# Patient Record
Sex: Female | Born: 1960 | Race: Black or African American | Hispanic: No | Marital: Single | Smoking: Former smoker
Health system: Southern US, Community
[De-identification: ages and names within clinical notes are randomized; demographics above are authoritative.]

## PROBLEM LIST (undated history)

## (undated) DIAGNOSIS — E0590 Thyrotoxicosis, unspecified without thyrotoxic crisis or storm: Secondary | ICD-10-CM

## (undated) HISTORY — DX: Thyrotoxicosis, unspecified without thyrotoxic crisis or storm: E05.90

---

## 2005-03-16 ENCOUNTER — Emergency Department: Payer: Self-pay | Admitting: Emergency Medicine

## 2012-08-23 ENCOUNTER — Emergency Department: Payer: Self-pay | Admitting: Emergency Medicine

## 2013-04-22 ENCOUNTER — Encounter: Payer: Self-pay | Admitting: Adult Health

## 2013-04-22 ENCOUNTER — Ambulatory Visit (INDEPENDENT_AMBULATORY_CARE_PROVIDER_SITE_OTHER): Payer: BC Managed Care – PPO | Admitting: Adult Health

## 2013-04-22 ENCOUNTER — Encounter (INDEPENDENT_AMBULATORY_CARE_PROVIDER_SITE_OTHER): Payer: Self-pay

## 2013-04-22 VITALS — BP 140/78 | HR 66 | Temp 98.2°F | Resp 14 | Ht 61.75 in | Wt 122.0 lb

## 2013-04-22 DIAGNOSIS — Z1211 Encounter for screening for malignant neoplasm of colon: Secondary | ICD-10-CM | POA: Insufficient documentation

## 2013-04-22 DIAGNOSIS — Z1239 Encounter for other screening for malignant neoplasm of breast: Secondary | ICD-10-CM | POA: Insufficient documentation

## 2013-04-22 DIAGNOSIS — H0520 Unspecified exophthalmos: Secondary | ICD-10-CM | POA: Insufficient documentation

## 2013-04-22 LAB — TSH: TSH: 0 u[IU]/mL — AB (ref 0.35–5.50)

## 2013-04-22 NOTE — Progress Notes (Signed)
Pre visit review using our clinic review tool, if applicable. No additional management support is needed unless otherwise documented below in the visit note. 

## 2013-04-22 NOTE — Progress Notes (Signed)
Patient ID: Elizabeth Pacheco, female   DOB: 05-04-1960, 53 y.o.   MRN: [id number]    Subjective:    Patient ID: Elizabeth Pacheco, female    DOB: 01/15/1961, 53 y.o.   MRN: [id number]  HPI  Pt is a pleasant 53 y/o female who presents to clinic to establish care. She feels healthy. She has not been to a PCP in > 25 years. Reports "if it ain't broke then don't fix it". She has been constantly encouraged by multiple family members to find a PCP. No screening tests have been done. She has not had a mammogram or colonoscopy. No PAP in 25 years. She does have one concern. She has noticed that her right eye is slightly bulging. She reports that her eye doctor told her that she needed to have her thyroid checked. She began reading information on the internet and thought it was overwhelming. She finally decided to follow her families request to find a provider.   History reviewed. No pertinent past medical history.   History reviewed. No pertinent past surgical history.   Family History  Problem Relation Age of Onset  . Diabetes Mother   . Cancer Father     Lung cancer  . Diabetes Sister   . Diabetes Brother   . Cancer Maternal Grandmother     Breast cancer  . Diabetes Sister   . Diabetes Sister   . Diabetes Brother   . Diabetes Brother   . Diabetes Brother   . Diabetes Brother      History   Social History  . Marital Status: Single    Spouse Name: N/A    Number of Children: N/A  . Years of Education: N/A   Occupational History  .      [employer]   Social History Main Topics  . Smoking status: Former Smoker -- 0.50 packs/day for 39 years    Types: Cigarettes  . Smokeless tobacco: Not on file     Comment: quit feb 2013   . Alcohol Use: No  . Drug Use: No  . Sexual Activity: Not on file   Other Topics Concern  . Not on file   Social History Narrative   Elizabeth Pacheco grew up in [place], [place]. She lives at home in [place] with her sister and daughter. They have one dog. She  enjoys Web designer projects. She is very active in her church.     Review of Systems  Constitutional: Negative.   HENT: Negative.   Eyes: Negative.        Right eye bulging  Respiratory: Negative.   Cardiovascular: Negative.   Gastrointestinal: Negative.   Endocrine: Negative.   Genitourinary: Negative.   Musculoskeletal: Negative.   Skin: Negative.   Allergic/Immunologic: Negative.   Neurological: Negative.   Hematological: Negative.   Psychiatric/Behavioral: Negative.        Objective:  BP 140/78  Pulse 66  Temp(Src) 98.2 F (36.8 C) (Oral)  Resp 14  Ht 5' 1.75" (1.568 m)  Wt 122 lb (55.339 kg)  BMI 22.51 kg/m2  SpO2 99%   Physical Exam  Constitutional: She is oriented to person, place, and time. She appears well-developed and well-nourished. No distress.  HENT:  Head: Normocephalic and atraumatic.  Right Ear: External ear normal.  Left Ear: External ear normal.  Nose: Nose normal.  Mouth/Throat: Oropharynx is clear and moist.  Eyes: Conjunctivae and EOM are normal.  Mild exophthalmos  Neck: Normal range of motion. Neck supple.  Cardiovascular: Normal rate, regular rhythm,  normal heart sounds and intact distal pulses.  Exam reveals no gallop and no friction rub.   No murmur heard. Pulmonary/Chest: Effort normal and breath sounds normal. No respiratory distress. She has no wheezes. She has no rales.  Musculoskeletal: Normal range of motion.  Neurological: She is alert and oriented to person, place, and time. She has normal reflexes.  Skin: Skin is warm and dry.  Psychiatric: She has a normal mood and affect. Her behavior is normal. Judgment and thought content normal.       Assessment & Plan:   1. Screen for colon cancer Refer to GI for screening colonoscopy. Denies blood in stool, constipation or diarrhea. Denies GI symptoms  - Ambulatory referral to Gastroenterology  2. Screening for breast cancer Ordered screening mammogram. She has not been to a PCP in > 25  years.  - MM DIGITAL SCREENING BILATERAL; Future  3. Exophthalmos Mild symptoms on the right eye. Pt denies anxiety, emotional lability, weakness, tremor, palpitations, heat intolerance, increased perspiration, and weight loss   Check labs. Will follow  - TSH

## 2013-04-22 NOTE — Patient Instructions (Addendum)
   Thank you for choosing [facility] for your [facility] care needs.  Please have your labs drawn prior to leaving the office.  The results will be available through MyChart for your convenience. Please remember to activate this. The activation code is located at the end of this form.  Remember to schedule your complete physical exam including your PAP.  I am referring you to GI for your screening colonoscopy.  I have also provided you with a prescription for your screening Mammogram. You can call and schedule that at your earliest convenience.

## 2013-04-23 ENCOUNTER — Other Ambulatory Visit: Payer: Self-pay | Admitting: Adult Health

## 2013-04-23 DIAGNOSIS — E010 Iodine-deficiency related diffuse (endemic) goiter: Secondary | ICD-10-CM

## 2013-04-29 ENCOUNTER — Ambulatory Visit: Payer: Self-pay | Admitting: Adult Health

## 2013-04-30 ENCOUNTER — Other Ambulatory Visit: Payer: Self-pay | Admitting: Adult Health

## 2013-04-30 ENCOUNTER — Other Ambulatory Visit (INDEPENDENT_AMBULATORY_CARE_PROVIDER_SITE_OTHER): Payer: BC Managed Care – PPO

## 2013-04-30 ENCOUNTER — Telehealth: Payer: Self-pay | Admitting: *Deleted

## 2013-04-30 DIAGNOSIS — E049 Nontoxic goiter, unspecified: Secondary | ICD-10-CM

## 2013-04-30 DIAGNOSIS — E010 Iodine-deficiency related diffuse (endemic) goiter: Secondary | ICD-10-CM

## 2013-04-30 DIAGNOSIS — E041 Nontoxic single thyroid nodule: Secondary | ICD-10-CM

## 2013-04-30 DIAGNOSIS — Z1239 Encounter for other screening for malignant neoplasm of breast: Secondary | ICD-10-CM

## 2013-04-30 LAB — T3, FREE: T3 FREE: 5.2 pg/mL — AB (ref 2.3–4.2)

## 2013-04-30 LAB — T4, FREE: FREE T4: 1.48 ng/dL (ref 0.60–1.60)

## 2013-04-30 NOTE — Telephone Encounter (Signed)
Advised pt of US results, advised pt that Amber would be in contact with her with an appt with the endocrine doctor.

## 2013-05-01 ENCOUNTER — Encounter: Payer: Self-pay | Admitting: Endocrinology

## 2013-05-01 ENCOUNTER — Ambulatory Visit (INDEPENDENT_AMBULATORY_CARE_PROVIDER_SITE_OTHER): Payer: BC Managed Care – PPO | Admitting: Endocrinology

## 2013-05-01 VITALS — BP 116/78 | HR 61 | Temp 97.8°F | Ht 61.75 in | Wt 122.0 lb

## 2013-05-01 DIAGNOSIS — E0590 Thyrotoxicosis, unspecified without thyrotoxic crisis or storm: Secondary | ICD-10-CM

## 2013-05-01 NOTE — Progress Notes (Signed)
Subjective:    Patient ID: Elizabeth Pacheco, female    DOB: Dec 09, 1960, 53 y.o.   MRN: [id number]  HPI Pt reports he was dx'ed with hyperthyroidism in early 2015.  she has never been on therapy for this.  she has never had XRT to the anterior neck, or thyroid surgery.  he does not consume kelp or any other prescribed or non-prescribed thyroid medication.  she has never been on amiodarone.  She has slight prominence of the right eye, but no assoc tearing.   No past medical history on file.  No past surgical history on file.  History   Social History  . Marital Status: Single    Spouse Name: N/A    Number of Children: N/A  . Years of Education: N/A   Occupational History  .      [employer]   Social History Main Topics  . Smoking status: Former Smoker -- 0.50 packs/day for 39 years    Types: Cigarettes  . Smokeless tobacco: Not on file     Comment: quit feb 2013   . Alcohol Use: No  . Drug Use: No  . Sexual Activity: Not on file   Other Topics Concern  . Not on file   Social History Narrative   Elizabeth Pacheco grew up in [place], [place]. She lives at home in [place] with her sister and daughter. They have one dog. She enjoys Web designer projects. She is very active in her church.    No current outpatient prescriptions on file prior to visit.   No current facility-administered medications on file prior to visit.    Allergies  Allergen Reactions  . Penicillins     Family History  Problem Relation Age of Onset  . Diabetes Mother   . Cancer Father     Lung cancer  . Diabetes Sister   . Diabetes Brother   . Cancer Maternal Grandmother     Breast cancer  . Diabetes Sister   . Diabetes Sister   . Diabetes Brother   . Diabetes Brother   . Diabetes Brother   . Diabetes Brother   half-sister had thyroidectomy (uncertain reason, but not cancer)  BP 116/78  Pulse 61  Temp(Src) 97.8 F (36.6 C) (Oral)  Ht 5' 1.75" (1.568 m)  Wt 122 lb (55.339 kg)  BMI 22.51 kg/m2  SpO2  97%    Review of Systems denies headache, hoarseness, double vision, palpitations, sob, diarrhea, polyuria, myalgias, excessive diaphoresis, numbness, tremor, anxiety, easy bruising, and rhinorrhea.  She has weight gain.  She has "hot flashes."     Objective:   Physical Exam VS: see vs page GEN: no distress HEAD: head: no deformity eyes: no periorbital swelling; moderate right proptosis (none on the left) external nose and ears are normal mouth: no lesion seen NECK: supple, thyroid is not enlarged CHEST WALL: no deformity LUNGS:  Clear to auscultation CV: reg rate and rhythm, no murmur ABD: abdomen is soft, nontender.  no hepatosplenomegaly.  not distended.  no hernia MUSCULOSKELETAL: muscle bulk and strength are grossly normal.  no obvious joint swelling.  gait is normal and steady EXTEMITIES: no deformity.  no edema PULSES: no carotid bruit NEURO:  cn 2-12 grossly intact.   readily moves all 4's.  sensation is intact to touch on all 4's SKIN:  Normal texture and temperature.  No rash or suspicious lesion is visible.   NODES:  None palpable at the neck PSYCH: alert, well-oriented.  Does not appear anxious nor depressed.   (  i reviewed US report) Lab Results  Component Value Date   TSH 0.00* 04/22/2013      Assessment & Plan:  Grave's dz, with opthalmopathy. Hyperthyroidism, due to the grave's dz Weight gain: sometimes seen in hyperthyroidism.

## 2013-05-01 NOTE — Patient Instructions (Signed)
Please consider the options we discussed, and let me know. If you chose the radioactive iodine, we would check a thyroid "scan" (a special, but easy and painless type of thyroid x ray).  It works like this: you go to the x-ray department of the hospital to swallow a pill, which contains a miniscule amount of radiation.  You will not notice any symptoms from this.  You will go back to the x-ray department the next day, to lie down in front of a camera.  The results of this will be sent to me.   Based on the results, i hope to order for you a treatment pill of radioactive iodine.  Although it is a larger amount of radiation, you will again notice no symptoms from this.  The pill is gone from your body in a few days (during which you should stay away from other people), but takes several months to work.  Therefore, please return here approximately 6-8 weeks after the treatment.  This treatment has been available for many years, and the only known side-effect is an underactive thyroid.  It is possible that i would eventually prescribe for you a thyroid hormone pill, which is very inexpensive.  You don't have to worry about side-effects of this thyroid hormone pill, because it is the same molecule your thyroid makes.

## 2013-05-05 ENCOUNTER — Other Ambulatory Visit: Payer: Self-pay | Admitting: Adult Health

## 2013-05-05 DIAGNOSIS — E0590 Thyrotoxicosis, unspecified without thyrotoxic crisis or storm: Secondary | ICD-10-CM

## 2013-05-06 ENCOUNTER — Other Ambulatory Visit (HOSPITAL_COMMUNITY)
Admission: RE | Admit: 2013-05-06 | Discharge: 2013-05-06 | Disposition: A | Discharge status: Home / Self Care | Payer: BC Managed Care – PPO | Source: Ambulatory Visit | Attending: Adult Health | Admitting: Adult Health

## 2013-05-06 ENCOUNTER — Ambulatory Visit (INDEPENDENT_AMBULATORY_CARE_PROVIDER_SITE_OTHER): Payer: BC Managed Care – PPO | Admitting: Adult Health

## 2013-05-06 ENCOUNTER — Encounter: Payer: Self-pay | Admitting: Adult Health

## 2013-05-06 VITALS — BP 118/74 | HR 55 | Temp 98.0°F | Ht 61.8 in | Wt 121.0 lb

## 2013-05-06 DIAGNOSIS — Z0000 Encounter for general adult medical examination without abnormal findings: Secondary | ICD-10-CM | POA: Insufficient documentation

## 2013-05-06 DIAGNOSIS — Z1151 Encounter for screening for human papillomavirus (HPV): Secondary | ICD-10-CM | POA: Insufficient documentation

## 2013-05-06 DIAGNOSIS — Z01419 Encounter for gynecological examination (general) (routine) without abnormal findings: Secondary | ICD-10-CM | POA: Insufficient documentation

## 2013-05-06 DIAGNOSIS — Z13820 Encounter for screening for osteoporosis: Secondary | ICD-10-CM | POA: Insufficient documentation

## 2013-05-06 LAB — CBC WITH DIFFERENTIAL/PLATELET
BASOS PCT: 0.3 % (ref 0.0–3.0)
Basophils Absolute: 0 10*3/uL (ref 0.0–0.1)
EOS PCT: 2 % (ref 0.0–5.0)
Eosinophils Absolute: 0.2 10*3/uL (ref 0.0–0.7)
HEMATOCRIT: 42.3 % (ref 36.0–46.0)
Hemoglobin: 14.1 g/dL (ref 12.0–15.0)
LYMPHS ABS: 4.2 10*3/uL — AB (ref 0.7–4.0)
Lymphocytes Relative: 40.5 % (ref 12.0–46.0)
MCHC: 33.2 g/dL (ref 30.0–36.0)
MCV: 87 fl (ref 78.0–100.0)
MONO ABS: 0.8 10*3/uL (ref 0.1–1.0)
MONOS PCT: 7.4 % (ref 3.0–12.0)
Neutro Abs: 5.1 10*3/uL (ref 1.4–7.7)
Neutrophils Relative %: 49.8 % (ref 43.0–77.0)
Platelets: 210 10*3/uL (ref 150.0–400.0)
RBC: 4.87 Mil/uL (ref 3.87–5.11)
RDW: 12.9 % (ref 11.5–14.6)
WBC: 10.3 10*3/uL (ref 4.5–10.5)

## 2013-05-06 LAB — COMPREHENSIVE METABOLIC PANEL
ALK PHOS: 170 U/L — AB (ref 39–117)
ALT: 31 U/L (ref 0–35)
AST: 24 U/L (ref 0–37)
Albumin: 4.2 g/dL (ref 3.5–5.2)
BILIRUBIN TOTAL: 0.5 mg/dL (ref 0.3–1.2)
BUN: 11 mg/dL (ref 6–23)
CO2: 27 mEq/L (ref 19–32)
Calcium: 10.8 mg/dL — ABNORMAL HIGH (ref 8.4–10.5)
Chloride: 106 mEq/L (ref 96–112)
Creatinine, Ser: 0.7 mg/dL (ref 0.4–1.2)
GFR: 113.77 mL/min (ref >60.00)
Glucose, Bld: 84 mg/dL (ref 70–99)
Potassium: 3.8 mEq/L (ref 3.5–5.1)
SODIUM: 142 meq/L (ref 135–145)
TOTAL PROTEIN: 7.7 g/dL (ref 6.0–8.3)

## 2013-05-06 LAB — LIPID PANEL
CHOL/HDL RATIO: 3
Cholesterol: 172 mg/dL (ref 0–200)
HDL: 51.5 mg/dL (ref >39.00)
LDL Cholesterol: 103 mg/dL — ABNORMAL HIGH (ref 0–99)
Triglycerides: 89 mg/dL (ref 0.0–149.0)
VLDL: 17.8 mg/dL (ref 0.0–40.0)

## 2013-05-06 LAB — VITAMIN B12: Vitamin B-12: 571 pg/mL (ref 211–911)

## 2013-05-06 NOTE — Patient Instructions (Signed)
  You had your yearly physical exam today.  I will call you with results of your labs and PAP once they are available.  I am ordering a DEXA scan to screen for osteoporosis.   I have already referred you to GI (Dr. Mec[facility]lliott at [facility]). It takes them approximately 1 month to schedule an appointment. If you have not heard from their office within the next few weeks please call our office and ask to [facility] to [facility].  Mammogram tomorrow.  Please call with any questions or concerns.

## 2013-05-06 NOTE — Addendum Note (Signed)
Addended by: Montine CircleMALDONADO, Kinza Gouveia D on: 05/06/2013 03:30 PM   Modules accepted: Orders

## 2013-05-06 NOTE — Progress Notes (Signed)
Patient ID: Elizabeth Pacheco, female   DOB: Dec 17, 1960, 53 y.o.   MRN: [id number]    Subjective:    Patient ID: Elizabeth NedCheryl Chapa, female    DOB: Dec 17, 1960, 53 y.o.   MRN: [id number]  HPI Pt is a pleasant 53 y/o female who presents for her yearly physical including breast and PAP. She has recently been seen by endocrine for hyperthyroidism. She is schedule on 05/27/13 for radioactive iodine treatment. She is scheduled for her mammogram tomorrow. She has been referred to GI for screening colonoscopy but reports that she has not been notified about appointment.   Review of Systems  Constitutional: Negative.   HENT: Negative.   Eyes: Negative.   Respiratory: Negative.   Cardiovascular: Negative.   Gastrointestinal: Negative.   Endocrine: Negative.   Genitourinary: Negative.   Musculoskeletal: Negative.   Skin: Negative.   Allergic/Immunologic: Negative.   Neurological: Negative.   Hematological: Negative.   Psychiatric/Behavioral: Negative.   All other systems reviewed and are negative.       Objective:  BP 118/74  Pulse 55  Temp(Src) 98 F (36.7 C) (Oral)  Ht 5' 1.8" (1.57 m)  Wt 121 lb (54.885 kg)  BMI 22.27 kg/m2  SpO2 99%   Physical Exam  Constitutional: She is oriented to person, place, and time. She appears well-developed and well-nourished. No distress.  HENT:  Head: Normocephalic and atraumatic.  Right Ear: External ear normal.  Left Ear: External ear normal.  Nose: Nose normal.  Mouth/Throat: Oropharynx is clear and moist.  Eyes: Conjunctivae and EOM are normal. Pupils are equal, round, and reactive to light.  Right eye exophthalmos   Neck: Normal range of motion. Neck supple. No tracheal deviation present. No thyromegaly present.  Cardiovascular: Normal rate, regular rhythm, normal heart sounds and intact distal pulses.  Exam reveals no gallop and no friction rub.   No murmur heard. Pulmonary/Chest: Effort normal and breath sounds normal. No respiratory distress. She  has no wheezes. She has no rales.  Abdominal: Soft. Bowel sounds are normal. She exhibits no distension and no mass. There is no tenderness. There is no rebound and no guarding. Hernia confirmed negative in the right inguinal area and confirmed negative in the left inguinal area.  Genitourinary: Uterus normal. Rectal exam shows no external hemorrhoid, no internal hemorrhoid, no fissure, no mass and no tenderness. Guaiac negative stool. No breast tenderness, discharge or bleeding. No labial fusion. There is no rash, tenderness, lesion or injury on the right labia. There is no rash, tenderness, lesion or injury on the left labia. Uterus is not deviated, not enlarged, not fixed and not tender. Cervix exhibits discharge. Cervix exhibits no motion tenderness and no friability. Right adnexum displays no mass, no tenderness and no fullness. Left adnexum displays no mass, no tenderness and no fullness. No erythema, tenderness or bleeding around the vagina. No foreign body around the vagina. No signs of injury around the vagina. Vaginal discharge found.  Musculoskeletal: Normal range of motion. She exhibits no edema and no tenderness.  Lymphadenopathy:    She has no cervical adenopathy.       Right: No inguinal adenopathy present.       Left: No inguinal adenopathy present.  Neurological: She is alert and oriented to person, place, and time. She has normal reflexes. No cranial nerve deficit. Coordination normal.  Skin: Skin is warm and dry.  Psychiatric: She has a normal mood and affect. Her behavior is normal. Judgment and thought content normal.  Assessment & Plan:   1. Routine general medical examination at a health care facility Normal physical exam including breast, pelvic/PAP. External genitalia without lesions, ulcerations, inflammation, warts. There is discharge noted. There are no external hemorrhoids. No cystocele, rectocele or prolapsed uterus. Speculum examination normal. Cervix without  inflammation, lesions, growth, nodules. Thick discharge noted. There was no bleeding. Vaginal walls also normal - pink and rugose without inflammation, discharge, ulcers or color changes. Bimanual exam also normal.  No tenderness noted with palpation of the uterus. No adnexal masses appreciated during exam. Mammogram scheduled for tomorrow. She has already been referred to GI for screening colonoscopy.  - CBC with Differential - Lipid panel - Vit D  25 hydroxy (rtn osteoporosis monitoring) - Vitamin B12 - Comprehensive metabolic panel - DG Bone Density; Future  2. Screening for osteoporosis Dexa scan ordered

## 2013-05-06 NOTE — Progress Notes (Signed)
Pre visit review using our clinic review tool, if applicable. No additional management support is needed unless otherwise documented below in the visit note. 

## 2013-05-07 ENCOUNTER — Ambulatory Visit: Payer: Self-pay | Admitting: Adult Health

## 2013-05-07 ENCOUNTER — Encounter: Payer: Self-pay | Admitting: Emergency Medicine

## 2013-05-07 ENCOUNTER — Other Ambulatory Visit: Payer: Self-pay | Admitting: Adult Health

## 2013-05-07 LAB — VITAMIN D 25 HYDROXY (VIT D DEFICIENCY, FRACTURES): Vit D, 25-Hydroxy: 22 ng/mL — ABNORMAL LOW (ref 30–89)

## 2013-05-07 LAB — WET PREP BY MOLECULAR PROBE
Candida species: NEGATIVE
Gardnerella vaginalis: NEGATIVE
TRICHOMONAS VAG: POSITIVE — AB

## 2013-05-07 MED ORDER — METRONIDAZOLE 500 MG PO TABS: 500.0000 mg | ORAL_TABLET | Freq: Once | ORAL | Status: DC

## 2013-05-07 NOTE — Progress Notes (Signed)
Spoke with pt about positive trichomonas. She will come in at her earliest convenience for additional STD testing. Metronidazole 2000 mg once

## 2013-05-08 ENCOUNTER — Encounter: Payer: Self-pay | Admitting: Adult Health

## 2013-05-08 ENCOUNTER — Ambulatory Visit (INDEPENDENT_AMBULATORY_CARE_PROVIDER_SITE_OTHER): Payer: BC Managed Care – PPO | Admitting: Adult Health

## 2013-05-08 VITALS — BP 122/70 | HR 60 | Temp 98.3°F | Resp 16 | Wt 121.0 lb

## 2013-05-08 DIAGNOSIS — Z113 Encounter for screening for infections with a predominantly sexual mode of transmission: Secondary | ICD-10-CM

## 2013-05-08 NOTE — Progress Notes (Signed)
Pre visit review using our clinic review tool, if applicable. No additional management support is needed unless otherwise documented below in the visit note. 

## 2013-05-08 NOTE — Progress Notes (Signed)
Raquel advised pt

## 2013-05-08 NOTE — Progress Notes (Signed)
Patient ID: Elizabeth Pacheco, female   DOB: Mar 24, 1960, 53 y.o.   MRN: [id number]    Subjective:    Patient ID: Elizabeth NedCheryl Mcneel, female    DOB: Mar 24, 1960, 53 y.o.   MRN: [id number]  HPI  Patient is a pleasant 53 year old female who recently tested positive for trichomonas. She is here for a additional STD testing. She is not experiencing any symptoms.  Past Medical History  Diagnosis Date  . Hyperthyroidism     Current Outpatient Prescriptions on File Prior to Visit  Medication Sig Dispense Refill  . metroNIDAZOLE (FLAGYL) 500 MG tablet Take 1 tablet (500 mg total) by mouth once.  4 tablet  0   No current facility-administered medications on file prior to visit.     Review of Systems  Genitourinary: Negative.  Negative for dysuria, vaginal discharge, difficulty urinating, genital sores, vaginal pain and pelvic pain.  All other systems reviewed and are negative.       Objective:  BP 122/70  Pulse 60  Temp(Src) 98.3 F (36.8 C) (Oral)  Resp 16  Wt 121 lb (54.885 kg)  SpO2 99%   Physical Exam  Constitutional: She is oriented to person, place, and time. She appears well-developed and well-nourished. No distress.  Cardiovascular: Normal rate.   Pulmonary/Chest: Effort normal. No respiratory distress.  Musculoskeletal: Normal range of motion.  Neurological: She is alert and oriented to person, place, and time.  Skin: Skin is warm and dry.  Psychiatric: She has a normal mood and affect. Her behavior is normal. Judgment and thought content normal.      Assessment & Plan:   1. Screen for STD (sexually transmitted disease) Pt is very concerned about recent STD. Spent time discussing results of her recent test and explanation for testing for other STD. Provided time to answer any questions that she had.  - GC/chlamydia probe amp, urine - RPR - HIV antibody - HSV(herpes simplex vrs) 1+2 ab-IgG

## 2013-05-09 LAB — GC/CHLAMYDIA PROBE AMP, URINE
CHLAMYDIA, SWAB/URINE, PCR: NEGATIVE
GC PROBE AMP, URINE: NEGATIVE

## 2013-05-09 LAB — HIV ANTIBODY (ROUTINE TESTING W REFLEX): HIV: NONREACTIVE

## 2013-05-09 LAB — RPR

## 2013-05-12 LAB — HSV(HERPES SIMPLEX VRS) I + II AB-IGG
HSV 1 GLYCOPROTEIN G AB, IGG: 8.28 IV — AB
HSV 2 GLYCOPROTEIN G AB, IGG: 3.36 IV — AB

## 2013-05-26 ENCOUNTER — Encounter: Payer: Self-pay | Admitting: Adult Health

## 2013-05-27 ENCOUNTER — Ambulatory Visit: Payer: Self-pay | Admitting: Endocrinology

## 2013-05-28 ENCOUNTER — Telehealth: Payer: Self-pay | Admitting: Endocrinology

## 2013-05-28 DIAGNOSIS — E0590 Thyrotoxicosis, unspecified without thyrotoxic crisis or storm: Secondary | ICD-10-CM

## 2013-05-28 NOTE — Telephone Encounter (Signed)
please call patient: i have ordered the radioactive iodine pill for you.  you will receive a phone call, about a day and time for an appointment

## 2013-05-28 NOTE — Telephone Encounter (Signed)
Pt informed

## 2013-06-16 ENCOUNTER — Ambulatory Visit: Payer: Self-pay | Admitting: Endocrinology

## 2013-06-18 ENCOUNTER — Encounter: Payer: Self-pay | Admitting: Adult Health

## 2013-06-19 ENCOUNTER — Other Ambulatory Visit: Payer: Self-pay | Admitting: Endocrinology

## 2013-06-19 ENCOUNTER — Other Ambulatory Visit: Payer: Self-pay

## 2013-06-19 LAB — HCG, QUANTITATIVE, PREGNANCY: Beta Hcg, Quant.: 2 m[IU]/mL

## 2013-06-22 ENCOUNTER — Ambulatory Visit: Payer: Self-pay | Admitting: Endocrinology

## 2013-06-24 ENCOUNTER — Encounter: Payer: Self-pay | Admitting: Endocrinology

## 2013-07-14 ENCOUNTER — Encounter: Payer: Self-pay | Admitting: Endocrinology

## 2013-08-13 ENCOUNTER — Encounter: Payer: Self-pay | Admitting: Endocrinology

## 2013-08-13 ENCOUNTER — Ambulatory Visit (INDEPENDENT_AMBULATORY_CARE_PROVIDER_SITE_OTHER): Payer: BC Managed Care – PPO | Admitting: Endocrinology

## 2013-08-13 VITALS — BP 122/86 | HR 56 | Temp 98.6°F | Ht 61.75 in | Wt 117.0 lb

## 2013-08-13 DIAGNOSIS — E0590 Thyrotoxicosis, unspecified without thyrotoxic crisis or storm: Secondary | ICD-10-CM

## 2013-08-13 LAB — T4, FREE: FREE T4: 1.77 ng/dL — AB (ref 0.60–1.60)

## 2013-08-13 LAB — TSH: TSH: 0.04 u[IU]/mL — AB (ref 0.35–4.50)

## 2013-08-13 NOTE — Patient Instructions (Signed)
blood tests are being requested for you today.  We'll contact you with results. Please come back for a follow-up appointment in 6 weeks.   

## 2013-08-13 NOTE — Progress Notes (Signed)
   Subjective:    Patient ID: Elizabeth Pacheco, female    DOB: 10-Sep-1960, 53 y.o.   MRN: [id number]  HPI Pt returns for f/u of hyperthyroidism (US had several small nodules, but scan was c/w Grave's (in early 2015; she had I-131 rx in May of 2015; she has never had XRT to the anterior neck, or thyroid surgery;  she does not consume kelp or any other prescribed or non-prescribed thyroid medication; she has never been on amiodarone). pt states she feels no different, and well in general.   Past Medical History  Diagnosis Date  . Hyperthyroidism     No past surgical history on file.  History   Social History  . Marital Status: Single    Spouse Name: N/A    Number of Children: N/A  . Years of Education: N/A   Occupational History  .      [employer]   Social History Main Topics  . Smoking status: Former Smoker -- 0.50 packs/day for 39 years    Types: Cigarettes  . Smokeless tobacco: Not on file     Comment: quit feb 2013   . Alcohol Use: No  . Drug Use: No  . Sexual Activity: Not on file   Other Topics Concern  . Not on file   Social History Narrative   Elizabeth MaxwellCheryl grew up in [place]. She lives at home in [place] with her sister and daughter. They have one dog. She enjoys Web designerDIY projects. She is very active in her church.    Current Outpatient Prescriptions on File Prior to Visit  Medication Sig Dispense Refill  . metroNIDAZOLE (FLAGYL) 500 MG tablet Take 1 tablet (500 mg total) by mouth once.  4 tablet  0   No current facility-administered medications on file prior to visit.    Allergies  Allergen Reactions  . Penicillins     Family History  Problem Relation Age of Onset  . Diabetes Mother   . Cancer Father     Lung cancer  . Diabetes Sister   . Diabetes Brother   . Cancer Maternal Grandmother     Breast cancer  . Diabetes Sister   . Diabetes Sister   . Diabetes Brother   . Diabetes Brother   . Diabetes Brother   . Diabetes Brother     BP 122/86   Pulse 56  Temp(Src) 98.6 F (37 C) (Oral)  Ht 5' 1.75" (1.568 m)  Wt 117 lb (53.071 kg)  BMI 21.59 kg/m2  SpO2 99%   Review of Systems Denies weight change.      Objective:   Physical Exam VITAL SIGNS:  See vs page GENERAL: no distress Eyes: proptosis (R>>L). NECK: There is no palpable thyroid enlargement.  No thyroid nodule is palpable.  No palpable lymphadenopathy at the anterior neck.    Lab Results  Component Value Date   TSH 0.04* 08/13/2013      Assessment & Plan:  hyperthyroidism: not better yet  Patient is advised the following: Patient Instructions  blood tests are being requested for you today.  We'll contact you with results. Please come back for a follow-up appointment in 6 weeks.    Thyroid is not better yet. We'll give it more time.

## 2013-09-23 ENCOUNTER — Ambulatory Visit: Payer: BC Managed Care – PPO | Admitting: Endocrinology

## 2013-09-23 DIAGNOSIS — Z0289 Encounter for other administrative examinations: Secondary | ICD-10-CM

## 2014-03-31 ENCOUNTER — Ambulatory Visit (INDEPENDENT_AMBULATORY_CARE_PROVIDER_SITE_OTHER): Payer: BLUE CROSS/BLUE SHIELD | Admitting: Endocrinology

## 2014-03-31 ENCOUNTER — Encounter: Payer: Self-pay | Admitting: Endocrinology

## 2014-03-31 VITALS — BP 114/80 | HR 49 | Temp 98.2°F | Ht 61.5 in | Wt 132.0 lb

## 2014-03-31 DIAGNOSIS — E0590 Thyrotoxicosis, unspecified without thyrotoxic crisis or storm: Secondary | ICD-10-CM

## 2014-03-31 LAB — T4, FREE: Free T4: 0 ng/dL — ABNORMAL LOW (ref 0.60–1.60)

## 2014-03-31 LAB — TSH: TSH: 79.63 u[IU]/mL — ABNORMAL HIGH (ref 0.35–4.50)

## 2014-03-31 MED ORDER — LEVOTHYROXINE SODIUM 125 MCG PO TABS: 125.0000 ug | ORAL_TABLET | Freq: Every day | ORAL | Status: DC

## 2014-03-31 NOTE — Progress Notes (Signed)
   Subjective:    Patient ID: Elizabeth Pacheco, female    DOB: 14-Oct-1960, 54 y.o.   MRN: [id number]  HPI Pt returns for f/u of hyperthyroidism (US had several small nodules, but scan was c/w Grave's (in early 2015; she had I-131 rx in May of 2015; she has never had XRT to the anterior neck, or thyroid surgery;  she does not consume kelp or any other prescribed or non-prescribed thyroid medication; she has never been on amiodarone). She has hoarseness and weight gain Past Medical History  Diagnosis Date  . Hyperthyroidism     No past surgical history on file.  History   Social History  . Marital Status: Single    Spouse Name: N/A  . Number of Children: N/A  . Years of Education: N/A   Occupational History  .      [employer]   Social History Main Topics  . Smoking status: Former Smoker -- 0.50 packs/day for 39 years    Types: Cigarettes  . Smokeless tobacco: Not on file     Comment: quit feb 2013   . Alcohol Use: No  . Drug Use: No  . Sexual Activity: Not on file   Other Topics Concern  . Not on file   Social History Narrative   Elizabeth MaxwellCheryl grew up in [place]. She lives at home in [place] with her sister and daughter. They have one dog. She enjoys Web designerDIY projects. She is very active in her church.    No current outpatient prescriptions on file prior to visit.   No current facility-administered medications on file prior to visit.    Allergies  Allergen Reactions  . Penicillins     Family History  Problem Relation Age of Onset  . Diabetes Mother   . Cancer Father     Lung cancer  . Diabetes Sister   . Diabetes Brother   . Cancer Maternal Grandmother     Breast cancer  . Diabetes Sister   . Diabetes Sister   . Diabetes Brother   . Diabetes Brother   . Diabetes Brother   . Diabetes Brother     BP 114/80 mmHg  Pulse 49  Temp(Src) 98.2 F (36.8 C) (Oral)  Ht 5' 1.5" (1.562 m)  Wt 132 lb (59.875 kg)  BMI 24.54 kg/m2  SpO2 98%  Review of  Systems She has fatigue    Objective:   Physical Exam VITAL SIGNS:  See vs page GENERAL: no distress Eyes: proptosis (R>>L). NECK: There is no palpable thyroid enlargement.  No thyroid nodule is palpable.  No palpable lymphadenopathy at the anterior neck.  Lab Results  Component Value Date   TSH 79.63* 03/31/2014       Assessment & Plan:  Post-I-131 hypothyroidism, new  Patient is advised the following: Patient Instructions  blood tests are being requested for you today.  We'll let you know about the results. Your thyroid is probably underactive, so i'll probably need to send a prescription for thyroid hormone to your pharmacy. Please come back for a follow-up appointment in 1 month.

## 2014-03-31 NOTE — Patient Instructions (Signed)
blood tests are being requested for you today.  We'll let you know about the results. Your thyroid is probably underactive, so i'll probably need to send a prescription for thyroid hormone to your pharmacy. Please come back for a follow-up appointment in 1 month.

## 2014-04-28 ENCOUNTER — Ambulatory Visit (INDEPENDENT_AMBULATORY_CARE_PROVIDER_SITE_OTHER): Payer: BLUE CROSS/BLUE SHIELD | Admitting: Endocrinology

## 2014-04-28 ENCOUNTER — Encounter: Payer: Self-pay | Admitting: Endocrinology

## 2014-04-28 VITALS — BP 132/80 | HR 61 | Temp 98.7°F | Ht 61.5 in | Wt 126.0 lb

## 2014-04-28 DIAGNOSIS — E890 Postprocedural hypothyroidism: Secondary | ICD-10-CM

## 2014-04-28 DIAGNOSIS — R490 Dysphonia: Secondary | ICD-10-CM

## 2014-04-28 LAB — T4, FREE: FREE T4: 1.89 ng/dL — AB (ref 0.60–1.60)

## 2014-04-28 LAB — TSH: TSH: 0.53 u[IU]/mL (ref 0.35–4.50)

## 2014-04-28 MED ORDER — LEVOTHYROXINE SODIUM 112 MCG PO TABS: 112.0000 ug | ORAL_TABLET | Freq: Every day | ORAL | Status: DC

## 2014-04-28 NOTE — Patient Instructions (Addendum)
blood tests are being requested for you today.  We'll let you know about the results.   Please see a specialist for the hoarseness.  you will receive a phone call, about a day and time for an appointment

## 2014-04-28 NOTE — Progress Notes (Signed)
   Subjective:    Patient ID: Elizabeth Pacheco, female    DOB: 08/26/1960, 54 y.o.   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  Moderate hoarseness persists, but there is no longer associated fatigue.  She says her fingernails are darkened.   Past Medical History  Diagnosis Date  . Hyperthyroidism     No past surgical history on file.  History   Social History  . Marital Status: Single    Spouse Name: N/A  . Number of Children: N/A  . Years of Education: N/A   Occupational History  .      [employer]   Social History Main Topics  . Smoking status: Former Smoker -- 0.50 packs/day for 39 years    Types: Cigarettes  . Smokeless tobacco: Not on file     Comment: quit feb 2013   . Alcohol Use: No  . Drug Use: No  . Sexual Activity: Not on file   Other Topics Concern  . Not on file   Social History Narrative   Elizabeth Pacheco grew up in [place]. She lives at home in [place] with her sister and daughter. They have one dog. She enjoys Web designerDIY projects. She is very active in her church.    No current outpatient prescriptions on file prior to visit.   No current facility-administered medications on file prior to visit.    Allergies  Allergen Reactions  . Penicillins     Family History  Problem Relation Age of Onset  . Diabetes Mother   . Cancer Father     Lung cancer  . Diabetes Sister   . Diabetes Brother   . Cancer Maternal Grandmother     Breast cancer  . Diabetes Sister   . Diabetes Sister   . Diabetes Brother   . Diabetes Brother   . Diabetes Brother   . Diabetes Brother     BP 132/80 mmHg  Pulse 61  Temp(Src) 98.7 F (37.1 C) (Oral)  Ht 5' 1.5" (1.562 m)  Wt 126  lb (57.153 kg)  BMI 23.42 kg/m2  SpO2 99%   Review of Systems Denies constipation and edema.      Objective:   Physical Exam VITAL SIGNS:  See vs page GENERAL: no distress. Eyes: proptosis (R>>L). NECK: There is no palpable thyroid enlargement.  No thyroid nodule is palpable.  No palpable lymphadenopathy at the anterior neck. Neuro: hoarseness persists.   Ext: fingernails are slightly dark.    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 Loss of voice, persistent. Dark fingernails, new, uncertain etiology.  We'll follow  Patient is advised the following: Patient Instructions  blood tests are being requested for you today.  We'll let you know about the results.   Please see a specialist for the hoarseness.  you will receive a phone call, about a day and time for an appointment  addendum: decrease synthroid to 112/d

## 2014-05-03 ENCOUNTER — Telehealth: Payer: Self-pay | Admitting: Endocrinology

## 2014-05-03 NOTE — Telephone Encounter (Signed)
Med has been lowered to 112 mg on her levothyroixine  What is the results of the lab that made this decrease occur

## 2014-05-03 NOTE — Telephone Encounter (Signed)
Contacted pt and advised that based off of lab work from 04/29/2014 thyroid medication needs to be decreased. Pt voiced understanding.

## 2014-10-29 ENCOUNTER — Other Ambulatory Visit: Payer: Self-pay | Admitting: Endocrinology

## 2014-11-25 ENCOUNTER — Telehealth: Payer: Self-pay | Admitting: Endocrinology

## 2014-11-26 ENCOUNTER — Ambulatory Visit: Payer: BLUE CROSS/BLUE SHIELD | Admitting: Endocrinology

## 2014-11-29 ENCOUNTER — Ambulatory Visit (INDEPENDENT_AMBULATORY_CARE_PROVIDER_SITE_OTHER): Payer: BLUE CROSS/BLUE SHIELD | Admitting: Endocrinology

## 2014-11-29 ENCOUNTER — Encounter: Payer: Self-pay | Admitting: Endocrinology

## 2014-11-29 VITALS — BP 136/82 | HR 60 | Temp 98.8°F | Ht 61.0 in | Wt 128.0 lb

## 2014-11-29 DIAGNOSIS — E890 Postprocedural hypothyroidism: Secondary | ICD-10-CM | POA: Diagnosis not present

## 2014-11-29 DIAGNOSIS — R490 Dysphonia: Secondary | ICD-10-CM

## 2014-11-29 LAB — T4, FREE: Free T4: 1.51 ng/dL (ref 0.60–1.60)

## 2014-11-29 LAB — TSH: TSH: 0.22 u[IU]/mL — AB (ref 0.35–4.50)

## 2014-11-29 MED ORDER — LEVOTHYROXINE SODIUM 75 MCG PO TABS: 75.0000 ug | ORAL_TABLET | Freq: Every day | ORAL | Status: DC

## 2014-11-29 NOTE — Patient Instructions (Addendum)
blood tests are being requested for you today.  We'll let you know about the results.   Please see a specialist for the hoarseness.  you will receive a phone call, about a day and time for an appointment. Please come back for a follow-up appointment in 6 months.

## 2014-11-29 NOTE — Telephone Encounter (Signed)
error 

## 2014-11-29 NOTE — Progress Notes (Signed)
   Subjective:    Patient ID: Elizabeth Pacheco, female    DOB: [birth year], 54 y.o.   MRN: [id number]  HPI Pt returns for f/u of hyperthyroidism (US had several small nodules, but scan was c/w Grave's dz; she had I-131 rx in May of 2015; she has never had XRT to the anterior neck, or thyroid surgery; she does not consume kelp or any other prescribed or non-prescribed thyroid medication; she has never been on amiodarone).   hoarseness persists.  She was ref to ENT, but she did not have insurance over the summer.   Past Medical History  Diagnosis Date  . Hyperthyroidism     No past surgical history on file.  Social History   Social History  . Marital Status: Single    Spouse Name: N/A  . Number of Children: N/A  . Years of Education: N/A   Occupational History  .      [employer]   Social History Main Topics  . Smoking status: Former Smoker -- 0.50 packs/day for 39 years    Types: Cigarettes  . Smokeless tobacco: Not on file     Comment: quit feb 2013   . Alcohol Use: No  . Drug Use: No  . Sexual Activity: Not on file   Other Topics Concern  . Not on file   Social History Narrative   Elnita MaxwellCheryl grew up in [place], [place]. She lives at home in [place] with her sister and daughter. They have one dog. She enjoys Web designerDIY projects. She is very active in her church.    No current outpatient prescriptions on file prior to visit.   No current facility-administered medications on file prior to visit.    Allergies  Allergen Reactions  . Penicillins     Family History  Problem Relation Age of Onset  . Diabetes Mother   . Cancer Father     Lung cancer  . Diabetes Sister   . Diabetes Brother   . Cancer Maternal Grandmother     Breast cancer  . Diabetes Sister   . Diabetes Sister   . Diabetes Brother   . Diabetes Brother   . Diabetes Brother   . Diabetes Brother     BP 136/82 mmHg  Pulse 60  Temp(Src) 98.8 F (37.1 C) (Oral)  Ht 5\' 1"  (1.549 m)  Wt 128 lb (58.06 kg)   BMI 24.20 kg/m2  SpO2 92%   Review of Systems Denies weight change.      Objective:   Physical Exam VITAL SIGNS:  See vs page.   GENERAL: no distress.   Eyes: there is bilateral proptosis (R slightly > L). NECK: There is no palpable thyroid enlargement.  No thyroid nodule is palpable.  No palpable lymphadenopathy at the anterior neck.   Neuro: hoarseness persists.  No tremor.  Gait is normal and steady.    Lab Results  Component Value Date   TSH 0.22* 11/29/2014      Assessment & Plan:  Post-I-131 hypothyroidism: she needs reduced rx.  This raises the possibility she is slowly evolving recurrent hyperthyroidism.  Hoarseness, persistent, uncertain etiology.  Patient is advised the following: Patient Instructions  blood tests are being requested for you today.  We'll let you know about the results.   Please see a specialist for the hoarseness.  you will receive a phone call, about a day and time for an appointment. Please come back for a follow-up appointment in 6 months.

## 2015-03-04 ENCOUNTER — Ambulatory Visit: Payer: BLUE CROSS/BLUE SHIELD | Admitting: Endocrinology

## 2015-03-23 IMAGING — CR RIGHT ANKLE - COMPLETE 3+ VIEW
2 series · 6 of 6 positions shown · non-contrast
Comparison: none

REASON FOR EXAM: fall, lateral ankle pain/swelling
COMMENTS:

[ap]
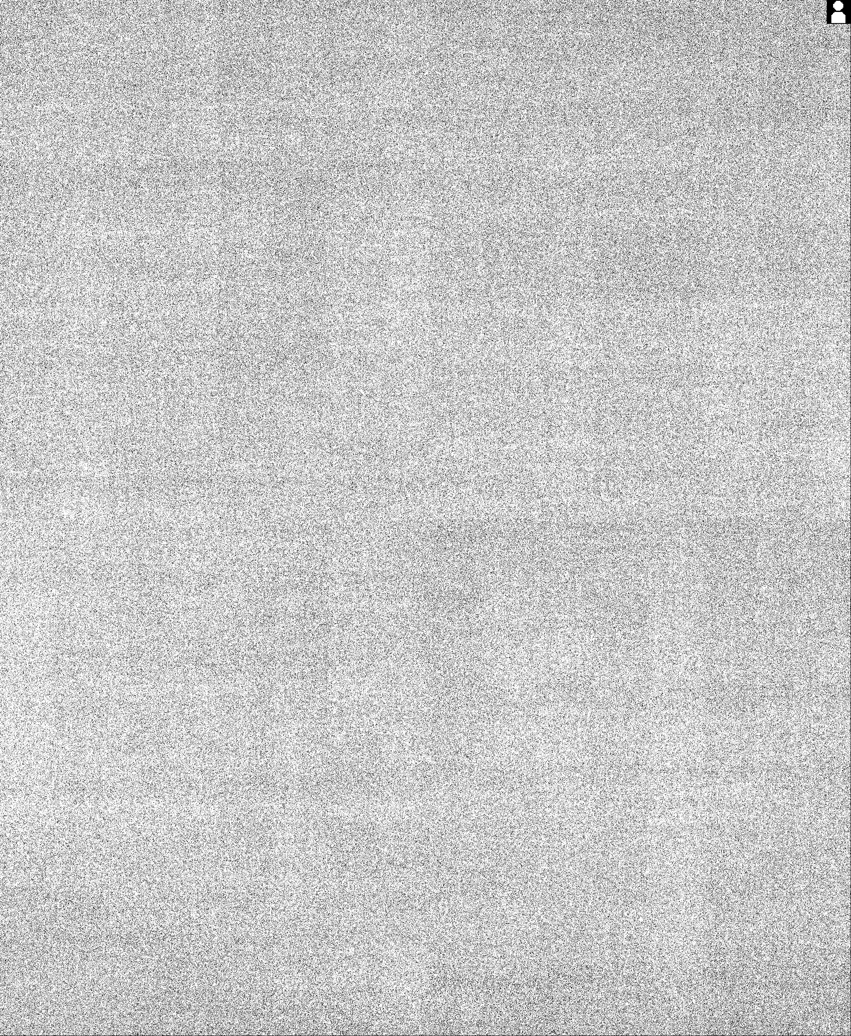

[Series 1: x ankle ap right · 0.14mm/px · 5 of 5 slices shown]
[im 1/5]
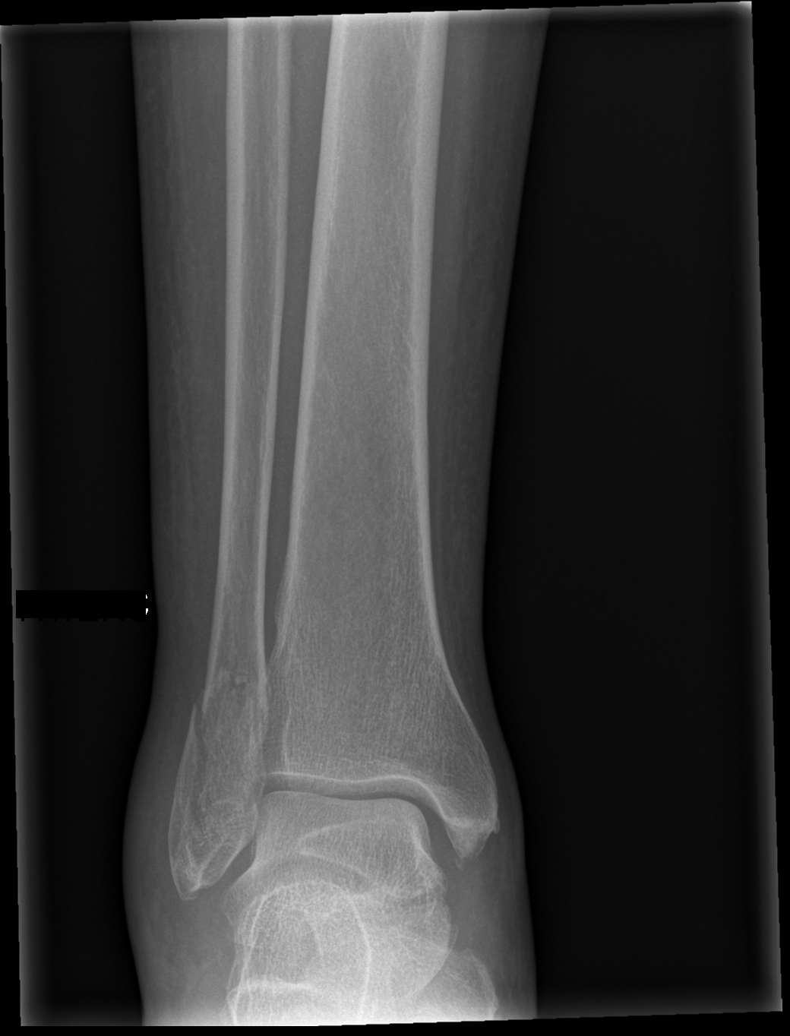
[im 2/5]
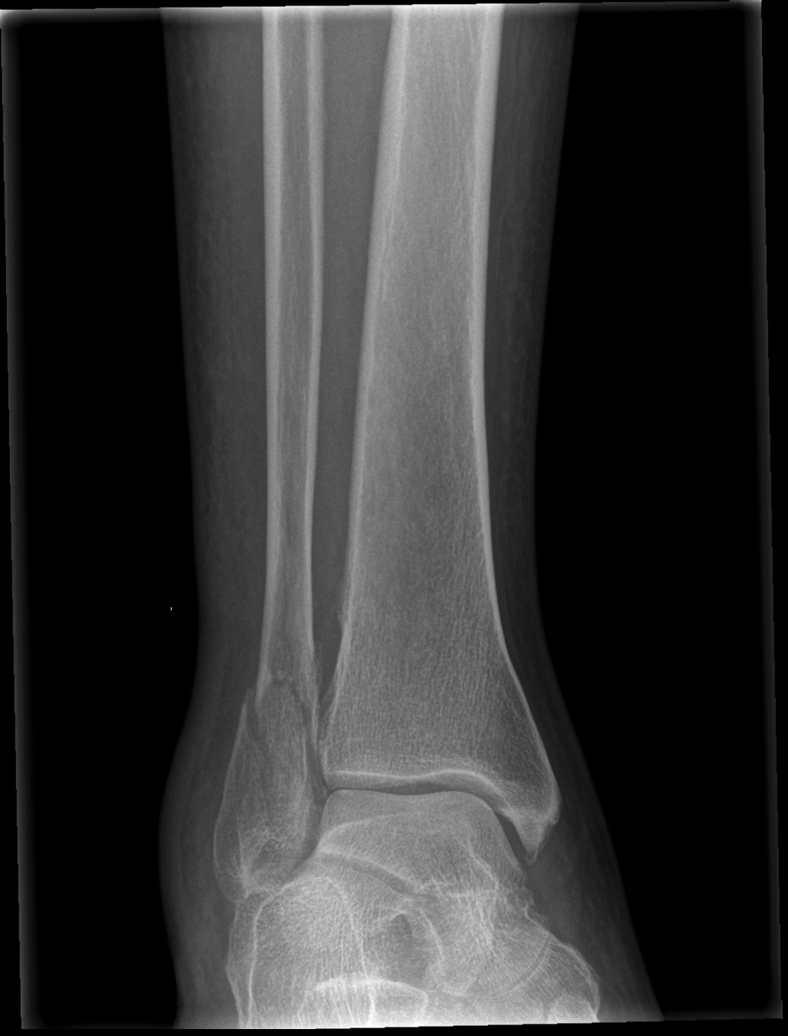
[im 3/5]
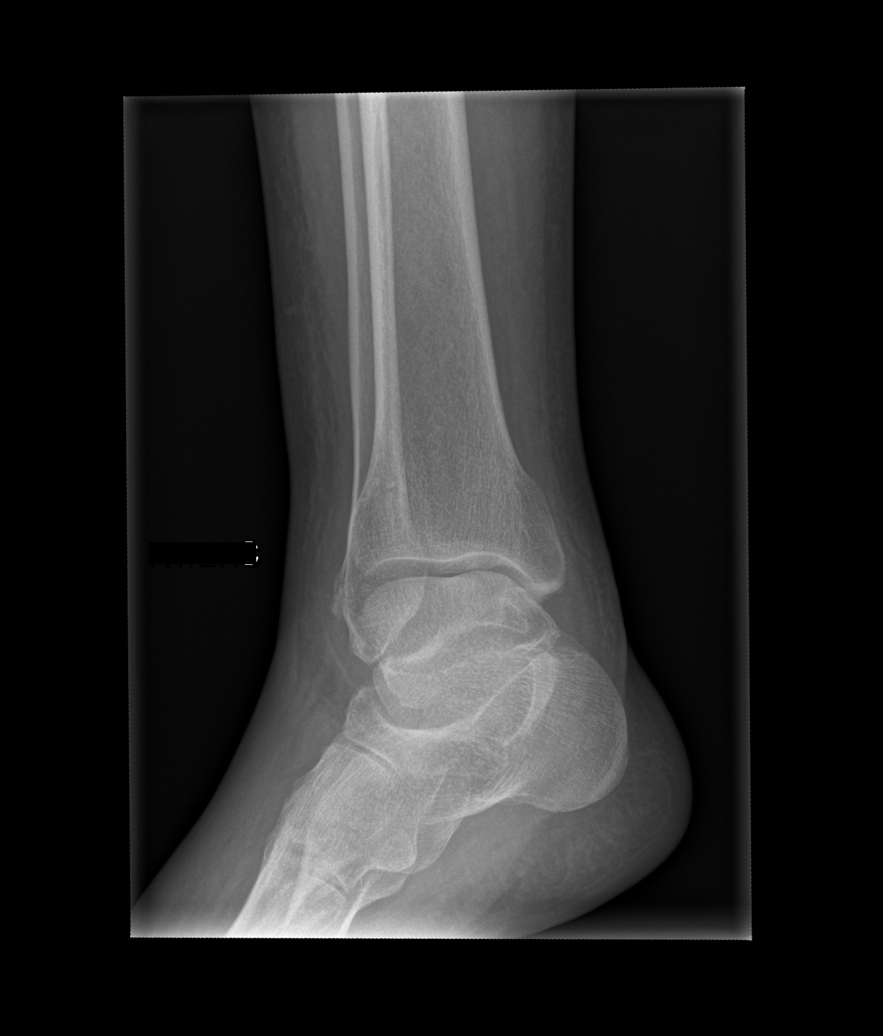
[im 4/5]
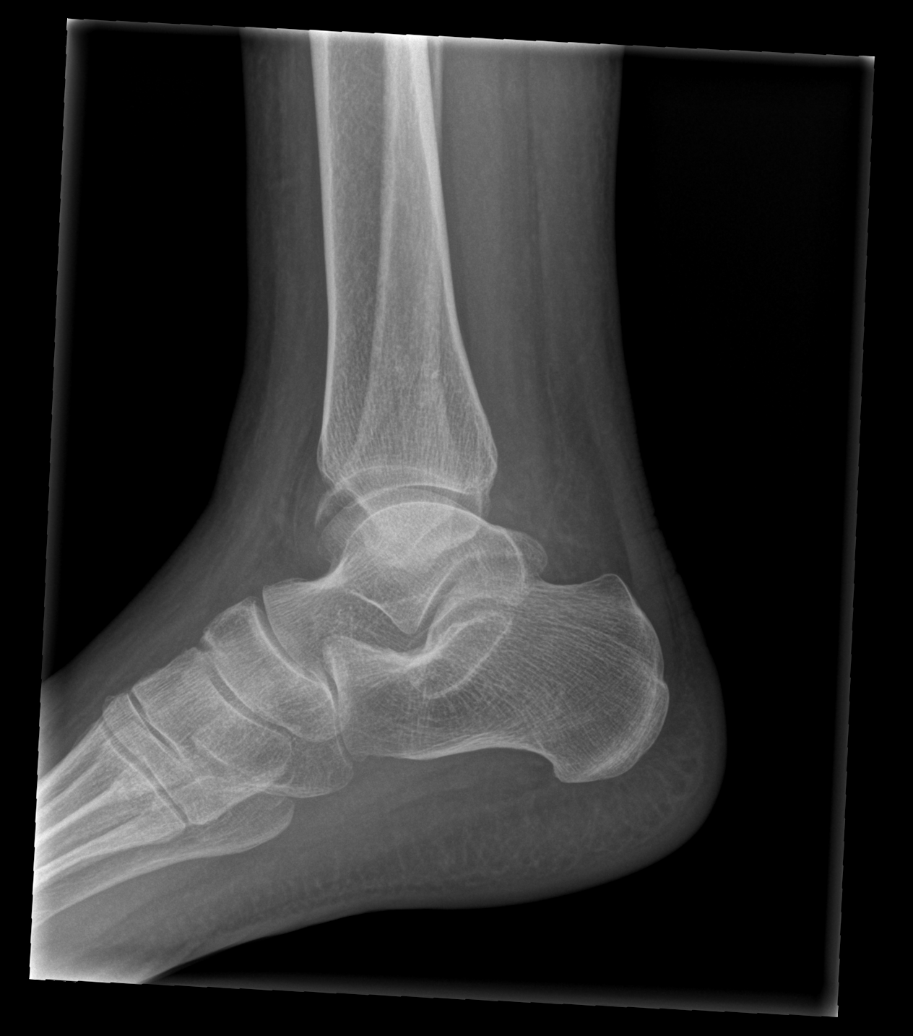
[im 5/5]
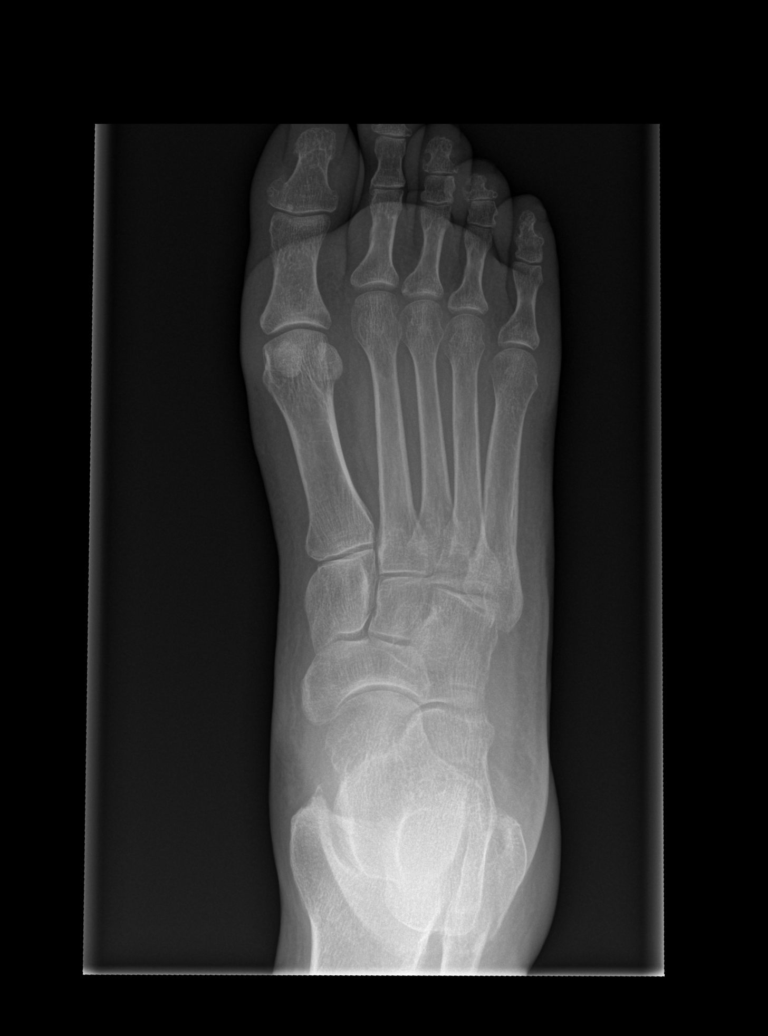

[6 of 6 positions shown; findings below may reference images not displayed]

PROCEDURE:     DXR - DXR ANKLE RIGHT COMPLETE  - August 23, 2012 [DATE]

RESULT:     Right ankle images demonstrate fracture with comminution and
slight distraction in the distal fibula. Degenerative changes are seen in
the medial malleolus region without a definite fracture. The tibia, talus
and calcaneus appear to be unremarkable.
IMPRESSION: Distal right fibular fracture with slight distraction and
some possible minimal comminution.

[REDACTED]

## 2015-05-23 ENCOUNTER — Other Ambulatory Visit: Payer: Self-pay | Admitting: Endocrinology

## 2015-06-03 ENCOUNTER — Ambulatory Visit: Payer: BLUE CROSS/BLUE SHIELD | Admitting: Endocrinology

## 2015-06-17 ENCOUNTER — Encounter: Payer: Self-pay | Admitting: Endocrinology

## 2015-06-17 ENCOUNTER — Ambulatory Visit (INDEPENDENT_AMBULATORY_CARE_PROVIDER_SITE_OTHER): Payer: BLUE CROSS/BLUE SHIELD | Admitting: Endocrinology

## 2015-06-17 VITALS — BP 128/84 | HR 56 | Temp 98.4°F | Resp 14 | Ht 61.5 in | Wt 136.0 lb

## 2015-06-17 DIAGNOSIS — E890 Postprocedural hypothyroidism: Secondary | ICD-10-CM | POA: Diagnosis not present

## 2015-06-17 NOTE — Progress Notes (Signed)
Pre visit review using our clinic review tool, if applicable. No additional management support is needed unless otherwise documented below in the visit note. 

## 2015-06-17 NOTE — Patient Instructions (Addendum)
A thyroid blood test is requested for you today.  We'll let you know about the results.  Please come back for a follow-up appointment in 6-12 months.  

## 2015-06-17 NOTE — Progress Notes (Signed)
   Subjective:    Patient ID: Elizabeth Pacheco, female    DOB: [birth year], 55 y.o.   MRN: [id number]  HPI Pt returns for f/u of hyperthyroidism (US had several small nodules, but scan was c/w Grave's dz; she had RAI in May of 2015; since then, she has required synthroid).  pt states she feels well in general, except for weight gain.   Past Medical History  Diagnosis Date  . Hyperthyroidism     No past surgical history on file.  Social History   Social History  . Marital Status: Single    Spouse Name: N/A  . Number of Children: N/A  . Years of Education: N/A   Occupational History  .      [employer]   Social History Main Topics  . Smoking status: Former Smoker -- 0.50 packs/day for 39 years    Types: Cigarettes  . Smokeless tobacco: Not on file     Comment: quit feb 2013   . Alcohol Use: No  . Drug Use: No  . Sexual Activity: Not on file   Other Topics Concern  . Not on file   Social History Narrative   Elnita MaxwellCheryl grew up in [place], [place]. She lives at home in [place] with her sister and daughter. They have one dog. She enjoys Web designerDIY projects. She is very active in her church.    Current Outpatient Prescriptions on File Prior to Visit  Medication Sig Dispense Refill  . levothyroxine (SYNTHROID, LEVOTHROID) 75 MCG tablet TAKE 1 TABLET (75 MCG TOTAL) BY MOUTH DAILY BEFORE BREAKFAST. 90 tablet 1   No current facility-administered medications on file prior to visit.    Allergies  Allergen Reactions  . Penicillins     Family History  Problem Relation Age of Onset  . Diabetes Mother   . Cancer Father     Lung cancer  . Diabetes Sister   . Diabetes Brother   . Cancer Maternal Grandmother     Breast cancer  . Diabetes Sister   . Diabetes Sister   . Diabetes Brother   . Diabetes Brother   . Diabetes Brother   . Diabetes Brother     BP 128/84 mmHg  Pulse 56  Temp(Src) 98.4 F (36.9 C) (Oral)  Resp 14  Ht 5' 1.5" (1.562 m)  Wt 136 lb (61.689 kg)  BMI  25.28 kg/m2  SpO2 96%  Review of Systems Intermittent hoarseness persists (ENT eval was neg).      Objective:   Physical Exam VITAL SIGNS:  See vs page.   GENERAL: no distress.   Eyes: there is mild R proptosis.   NECK: There is no palpable thyroid enlargement.  No thyroid nodule is palpable.  No palpable lymphadenopathy at the anterior neck.     Lab Results  Component Value Date   TSH 0.87 06/17/2015      Assessment & Plan:  Hypothyroidism: well-replaced.  Patient is advised the following: Patient Instructions  A thyroid blood test is requested for you today.  We'll let you know about the results.   Please come back for a follow-up appointment in 6-12 months.    addendum: Please continue the same medication

## 2015-06-18 LAB — TSH: TSH: 0.87 mIU/L

## 2015-06-27 ENCOUNTER — Encounter: Payer: Self-pay | Admitting: Endocrinology

## 2015-11-21 ENCOUNTER — Other Ambulatory Visit: Payer: Self-pay | Admitting: Endocrinology

## 2016-04-13 ENCOUNTER — Ambulatory Visit (INDEPENDENT_AMBULATORY_CARE_PROVIDER_SITE_OTHER): Payer: BLUE CROSS/BLUE SHIELD | Admitting: Endocrinology

## 2016-04-13 ENCOUNTER — Encounter: Payer: Self-pay | Admitting: Endocrinology

## 2016-04-13 VITALS — BP 122/74 | HR 50 | Ht 61.5 in | Wt 129.0 lb

## 2016-04-13 DIAGNOSIS — E890 Postprocedural hypothyroidism: Secondary | ICD-10-CM | POA: Diagnosis not present

## 2016-04-13 LAB — TSH: TSH: 0.46 u[IU]/mL (ref 0.35–4.50)

## 2016-04-13 NOTE — Progress Notes (Signed)
   Subjective:    Patient ID: Elizabeth Pacheco, female    DOB: [birth year], 58 y.o.   MRN: [id number]  HPI Pt returns for f/u of hyperthyroidism (US had several small nodules, but scan was c/w Grave's Dz; she had RAI in May of 2015; since then, she has required less than a full dosage of synthroid).  pt states she feels well in general, except for dry skin Past Medical History:  Diagnosis Date  . Hyperthyroidism     No past surgical history on file.  Social History   Social History  . Marital status: Single    Spouse name: N/A  . Number of children: N/A  . Years of education: N/A   Occupational History  .  [employer]   Social History Main Topics  . Smoking status: Never Smoker  . Smokeless tobacco: Never Used     Comment: quit feb 2013   . Alcohol use No  . Drug use: No  . Sexual activity: Not on file   Other Topics Concern  . Not on file   Social History Narrative   Elizabeth MaxwellCheryl grew up in [place], [place]. She lives at home in [place] with her sister and daughter. They have one dog. She enjoys Web designerDIY projects. She is very active in her church.    Current Outpatient Prescriptions on File Prior to Visit  Medication Sig Dispense Refill  . levothyroxine (SYNTHROID, LEVOTHROID) 75 MCG tablet TAKE 1 TABLET (75 MCG TOTAL) BY MOUTH DAILY BEFORE BREAKFAST. 90 tablet 1   No current facility-administered medications on file prior to visit.     Allergies  Allergen Reactions  . Penicillins     Family History  Problem Relation Age of Onset  . Diabetes Mother   . Cancer Father     Lung cancer  . Diabetes Sister   . Diabetes Brother   . Cancer Maternal Grandmother     Breast cancer  . Diabetes Sister   . Diabetes Sister   . Diabetes Brother   . Diabetes Brother   . Diabetes Brother   . Diabetes Brother     BP 122/74   Pulse (!) 50   Ht 5' 1.5" (1.562 m)   Wt 129 lb (58.5 kg)   SpO2 98%   BMI 23.98 kg/m    Review of Systems Denies edema      Objective:   Physical Exam VITAL SIGNS:  See vs page.   GENERAL: no distress.   NECK: There is no palpable thyroid enlargement.  No thyroid nodule is palpable.  No palpable lymphadenopathy at the anterior neck.    Lab Results  Component Value Date   TSH 0.46 04/13/2016      Assessment & Plan:  Hypothyroidism: well-replaced. Please continue the same medication

## 2016-04-13 NOTE — Patient Instructions (Signed)
A thyroid blood test is requested for you today.  We'll let you know about the results.  Please come back for a follow-up appointment in 6-12 months.  

## 2016-04-25 ENCOUNTER — Telehealth: Payer: Self-pay | Admitting: Endocrinology

## 2016-04-25 NOTE — Telephone Encounter (Signed)
I contacted the patient and advised of message via voicemail. Requested a call back if the patient would like to discuss.  

## 2016-04-25 NOTE — Telephone Encounter (Signed)
Ok to take

## 2016-04-25 NOTE — Telephone Encounter (Signed)
See message and please advise, Thanks!  

## 2016-04-25 NOTE — Telephone Encounter (Signed)
Patient asking is it ok to take the b 12, along with the levothyroxine (SYNTHROID, LEVOTHROID) 75 MCG tablet??  patient stated she is so tired and sluggish, and it is not normal for her.  Please advise

## 2016-05-28 ENCOUNTER — Other Ambulatory Visit: Payer: Self-pay | Admitting: Endocrinology

## 2016-05-28 NOTE — Telephone Encounter (Signed)
We have 75 mcg/d, just as you do.  I have sent a prescription to your pharmacy, to refill

## 2016-05-28 NOTE — Telephone Encounter (Signed)
Patient would like to know if Dr Everardo All changed her medication dosage, because pharmacy called stated they could only fill the 124 mg of levothyroxine.  She stated she do not take that.  She take 75 mg of levothyroxine. And she has one more pill left and need them today. please advise

## 2016-10-16 ENCOUNTER — Ambulatory Visit: Payer: BLUE CROSS/BLUE SHIELD | Admitting: Endocrinology

## 2016-10-16 DIAGNOSIS — Z0289 Encounter for other administrative examinations: Secondary | ICD-10-CM

## 2016-11-20 ENCOUNTER — Other Ambulatory Visit: Payer: Self-pay | Admitting: Endocrinology

## 2016-12-05 DIAGNOSIS — J309 Allergic rhinitis, unspecified: Secondary | ICD-10-CM | POA: Diagnosis not present

## 2016-12-05 DIAGNOSIS — J069 Acute upper respiratory infection, unspecified: Secondary | ICD-10-CM | POA: Diagnosis not present

## 2017-05-02 DIAGNOSIS — H40003 Preglaucoma, unspecified, bilateral: Secondary | ICD-10-CM | POA: Diagnosis not present

## 2017-05-19 ENCOUNTER — Other Ambulatory Visit: Payer: Self-pay | Admitting: Endocrinology

## 2017-05-19 NOTE — Telephone Encounter (Signed)
Please refill x 1 Ov is due  

## 2017-06-13 DIAGNOSIS — H40003 Preglaucoma, unspecified, bilateral: Secondary | ICD-10-CM | POA: Diagnosis not present

## 2017-11-10 ENCOUNTER — Other Ambulatory Visit: Payer: Self-pay | Admitting: Endocrinology

## 2017-11-10 NOTE — Telephone Encounter (Signed)
Please refill x 1 Ov is due  

## 2017-11-12 ENCOUNTER — Other Ambulatory Visit: Payer: Self-pay | Admitting: Endocrinology

## 2017-11-13 ENCOUNTER — Telehealth: Payer: Self-pay | Admitting: Endocrinology

## 2017-11-13 MED ORDER — LEVOTHYROXINE SODIUM 75 MCG PO TABS: ORAL_TABLET | ORAL | 0 refills | Status: DC

## 2017-11-13 NOTE — Telephone Encounter (Signed)
Sent!

## 2017-11-13 NOTE — Telephone Encounter (Signed)
Patient is stating pharmacy received note from [facility] on refusal for RX refill. She has not been seen since 10/2016, patient made appt for 10/15. Please advise on refusal, thanks Ph # [phone] can leave detailed message

## 2017-11-26 ENCOUNTER — Encounter: Payer: Self-pay | Admitting: Endocrinology

## 2017-11-26 ENCOUNTER — Ambulatory Visit (INDEPENDENT_AMBULATORY_CARE_PROVIDER_SITE_OTHER): Payer: BLUE CROSS/BLUE SHIELD | Admitting: Endocrinology

## 2017-11-26 VITALS — BP 142/80 | HR 54 | Ht 61.0 in | Wt 133.6 lb

## 2017-11-26 DIAGNOSIS — E890 Postprocedural hypothyroidism: Secondary | ICD-10-CM

## 2017-11-26 LAB — T4, FREE: Free T4: 1.11 ng/dL (ref 0.60–1.60)

## 2017-11-26 LAB — TSH: TSH: 0.76 u[IU]/mL (ref 0.35–4.50)

## 2017-11-26 NOTE — Progress Notes (Signed)
Subjective:    Patient ID: Elizabeth Pacheco, female    DOB: [birth year], 57 y.o.   MRN: [id number]  HPI Pt returns for f/u of hyperthyroidism ([place] had several very small nodules, but scan was c/w Grave's Dz; she had RAI in May of 2015; since then, she has required less than a full dosage of synthroid).  pt states she feels well in general, except for fatigue Past Medical History:  Diagnosis Date  . Hyperthyroidism     No past surgical history on file.  Social History   Socioeconomic History  . Marital status: Single    Spouse name: Not on file  . Number of children: Not on file  . Years of education: Not on file  . Highest education level: Not on file  Occupational History    Employer: [employer]    Comment: [employer]  Social Needs  . Financial resource strain: Not on file  . Food insecurity:    Worry: Not on file    Inability: Not on file  . Transportation needs:    Medical: Not on file    Non-medical: Not on file  Tobacco Use  . Smoking status: Former Smoker    Packs/day: 0.50    Years: 39.00    Pack years: 19.50    Types: Cigarettes    Last attempt to quit: 2011    Years since quitting: 8.7  . Smokeless tobacco: Never Used  Substance and Sexual Activity  . Alcohol use: No  . Drug use: No  . Sexual activity: Not on file  Lifestyle  . Physical activity:    Days per week: Not on file    Minutes per session: Not on file  . Stress: Not on file  Relationships  . Social connections:    Talks on phone: Not on file    Gets together: Not on file    Attends religious service: Not on file    Active member of club or organization: Not on file    Attends meetings of clubs or organizations: Not on file    Relationship status: Not on file  . Intimate partner violence:    Fear of current or ex partner: Not on file    Emotionally abused: Not on file    Physically abused: Not on file    Forced sexual activity: Not on file  Other Topics Concern  . Not on file  Social  History Narrative   Elizabeth Pacheco grew up in [place], [place]. She lives at home in [place] with her sister and daughter. They have one dog. She enjoys Web designer projects. She is very active in her church.    No current outpatient medications on file prior to visit.   No current facility-administered medications on file prior to visit.     Allergies  Allergen Reactions  . Penicillins     Family History  Problem Relation Age of Onset  . Diabetes Mother   . Cancer Father        Lung cancer  . Diabetes Sister   . Diabetes Brother   . Cancer Maternal Grandmother        Breast cancer  . Diabetes Sister   . Diabetes Sister   . Diabetes Brother   . Diabetes Brother   . Diabetes Brother   . Diabetes Brother     BP (!) 142/80 (BP Location: Right Arm, Patient Position: Sitting, Cuff Size: Normal)   Pulse (!) 54   Ht 5\' 1"  (1.549 m)  Wt 133 lb 9.6 oz (60.6 kg)   SpO2 94%   BMI 25.24 kg/m    Review of Systems Denies depression.      Objective:   Physical Exam VITAL SIGNS:  See vs page GENERAL: no distress NECK: There is no palpable thyroid enlargement.  No thyroid nodule is palpable.  No palpable lymphadenopathy at the anterior neck.    Lab Results  Component Value Date   TSH 0.76 11/26/2017      Assessment & Plan:  Hypothyroidism: well-replaced Multinodular goiter: despite this being small, this means she is at risk for recurrence of hyperthyrodism  Patient Instructions  blood tests are requested for you today.  We'll let you know about the results.   Please come back for a follow-up appointment in 1 year.

## 2017-11-26 NOTE — Patient Instructions (Signed)
blood tests are requested for you today.  We'll let you know about the results. Please come back for a follow-up appointment in 1 year.   

## 2017-11-27 ENCOUNTER — Telehealth: Payer: Self-pay | Admitting: Endocrinology

## 2017-11-27 ENCOUNTER — Telehealth: Payer: Self-pay

## 2017-11-27 MED ORDER — LEVOTHYROXINE SODIUM 75 MCG PO TABS: ORAL_TABLET | ORAL | 0 refills | Status: DC

## 2017-11-27 NOTE — Telephone Encounter (Signed)
-----   Message from Romero Belling, MD sent at 11/26/2017  6:13 PM EDT ----- please call patient: Normal.  Please continue the same medication. I'll see you next time.

## 2017-11-27 NOTE — Telephone Encounter (Signed)
Spoke with patient and stated lab results where normal and to continue same medication. She stated her RX was only filled for 30 days, would like to make it 90 days on the next refill. Please Advise. levothyroxine (SYNTHROID, LEVOTHROID) 75 MCG tablet [id number] - [place], [place]

## 2017-11-27 NOTE — Telephone Encounter (Signed)
Called pt and informed of lab results. Unable to reach pt nor LVM to request returned call. Will continue efforts to reach pt.

## 2017-11-27 NOTE — Telephone Encounter (Signed)
Rx [facility] for 90 tabs rather than 30 tabs. Pt seen 11/26/17. Next OV 11/26/18.

## 2017-11-27 NOTE — Addendum Note (Signed)
Addended by: Peterson Ao, Shayn Madole J on: 11/27/2017 04:27 PM   Modules accepted: Orders

## 2017-11-28 NOTE — Telephone Encounter (Signed)
Second attempt to reach pt successful. Pt made aware of lab results, to continue medication as prescribed and to keep appt with Dr. Everardo All as scheduled. Pt also informed that a Rx was sent to her pharmacy re: her request to change Rx from 30 tabs to 90 tabs. Verbalized acceptance and understanding.

## 2017-12-17 DIAGNOSIS — H40003 Preglaucoma, unspecified, bilateral: Secondary | ICD-10-CM | POA: Diagnosis not present

## 2017-12-17 DIAGNOSIS — H34831 Tributary (branch) retinal vein occlusion, right eye, with macular edema: Secondary | ICD-10-CM | POA: Diagnosis not present

## 2018-01-22 DIAGNOSIS — H3582 Retinal ischemia: Secondary | ICD-10-CM | POA: Diagnosis not present

## 2018-01-22 DIAGNOSIS — H34831 Tributary (branch) retinal vein occlusion, right eye, with macular edema: Secondary | ICD-10-CM | POA: Diagnosis not present

## 2018-02-21 DIAGNOSIS — H34831 Tributary (branch) retinal vein occlusion, right eye, with macular edema: Secondary | ICD-10-CM | POA: Diagnosis not present

## 2018-03-12 ENCOUNTER — Other Ambulatory Visit: Payer: Self-pay | Admitting: Endocrinology

## 2018-03-28 DIAGNOSIS — H34831 Tributary (branch) retinal vein occlusion, right eye, with macular edema: Secondary | ICD-10-CM | POA: Diagnosis not present

## 2018-05-04 ENCOUNTER — Other Ambulatory Visit: Payer: Self-pay | Admitting: Endocrinology

## 2018-08-14 ENCOUNTER — Other Ambulatory Visit: Payer: Self-pay | Admitting: Endocrinology

## 2018-11-24 ENCOUNTER — Other Ambulatory Visit: Payer: Self-pay

## 2018-11-26 ENCOUNTER — Other Ambulatory Visit: Payer: Self-pay

## 2018-11-26 ENCOUNTER — Encounter: Payer: Self-pay | Admitting: Endocrinology

## 2018-11-26 ENCOUNTER — Ambulatory Visit (INDEPENDENT_AMBULATORY_CARE_PROVIDER_SITE_OTHER): Payer: BC Managed Care – PPO | Admitting: Endocrinology

## 2018-11-26 ENCOUNTER — Ambulatory Visit: Payer: BLUE CROSS/BLUE SHIELD | Admitting: Endocrinology

## 2018-11-26 VITALS — BP 124/78 | HR 70 | Ht 61.0 in | Wt 130.4 lb

## 2018-11-26 DIAGNOSIS — E890 Postprocedural hypothyroidism: Secondary | ICD-10-CM | POA: Diagnosis not present

## 2018-11-26 NOTE — Patient Instructions (Signed)
Blood tests are requested for you today.  We'll let you know about the results.   ?Your require less than a full daily replacement amount of thyroid hormone.  This means that the thyroid was not completely destroyed by the radioactive iodine.  Therefore you have some risk that the overactivity could come back.  this may take many years to happen.  If this happens, you would first notice that your medication requirement would decrease to none.  Then the overactivity would come back.  We'll follow your blood tests for this.   ?Please come back for a follow-up appointment in 1 year.   ?

## 2018-11-26 NOTE — Progress Notes (Signed)
Subjective:    Patient ID: Elizabeth Pacheco, female    DOB: 1960/11/04, 58 y.o.   MRN: [id number]  HPI Pt returns for f/u of post-RAI hypothyroidism ([place] had several very small nodules, but scan was c/w Grave's Dz; she had RAI in May of 2015; since then, she has required less than a full dosage of synthroid).  pt states she feels well in general.   Past Medical History:  Diagnosis Date  . Hyperthyroidism     No past surgical history on file.  Social History   Socioeconomic History  . Marital status: Single    Spouse name: Not on file  . Number of children: Not on file  . Years of education: Not on file  . Highest education level: Not on file  Occupational History    Employer: [employer]    Comment: [employer]  Social Needs  . Financial resource strain: Not on file  . Food insecurity    Worry: Not on file    Inability: Not on file  . Transportation needs    Medical: Not on file    Non-medical: Not on file  Tobacco Use  . Smoking status: Former Smoker    Packs/day: 0.50    Years: 39.00    Pack years: 19.50    Types: Cigarettes    Quit date: 2011    Years since quitting: 9.8  . Smokeless tobacco: Never Used  Substance and Sexual Activity  . Alcohol use: No  . Drug use: No  . Sexual activity: Not on file  Lifestyle  . Physical activity    Days per week: Not on file    Minutes per session: Not on file  . Stress: Not on file  Relationships  . Social Herbalist on phone: Not on file    Gets together: Not on file    Attends religious service: Not on file    Active member of club or organization: Not on file    Attends meetings of clubs or organizations: Not on file    Relationship status: Not on file  . Intimate partner violence    Fear of current or ex partner: Not on file    Emotionally abused: Not on file    Physically abused: Not on file    Forced sexual activity: Not on file  Other Topics Concern  . Not on file  Social History Narrative   Jasminne  grew up in [place], [place]. She lives at home in [place] with her sister and daughter. They have one dog. She enjoys Passenger transport manager projects. She is very active in her church.    Current Outpatient Medications on File Prior to Visit  Medication Sig Dispense Refill  . levothyroxine (SYNTHROID) 75 MCG tablet TAKE 1 TABLET (75 MCG TOTAL) BY MOUTH DAILY BEFORE BREAKFAST. 90 tablet 0   No current facility-administered medications on file prior to visit.     Allergies  Allergen Reactions  . Penicillins     Family History  Problem Relation Age of Onset  . Diabetes Mother   . Cancer Father        Lung cancer  . Diabetes Sister   . Diabetes Brother   . Cancer Maternal Grandmother        Breast cancer  . Diabetes Sister   . Diabetes Sister   . Diabetes Brother   . Diabetes Brother   . Diabetes Brother   . Diabetes Brother     BP 124/78 (BP Location:  Left Arm, Patient Position: Sitting, Cuff Size: Normal)   Pulse 70   Ht 5\' 1"  (1.549 m)   Wt 130 lb 6.4 oz (59.1 kg)   SpO2 98%   BMI 24.64 kg/m    Review of Systems She has lost a few lbs.      Objective:   Physical Exam VITAL SIGNS:  See vs page.  GENERAL: no distress.  NECK: There is no palpable thyroid enlargement.  No thyroid nodule is palpable.  No palpable lymphadenopathy at the anterior neck.    Lab Results  Component Value Date   TSH 0.57 11/26/2018      Assessment & Plan:  Hypothyroidism: well-controlled Grave's Dz: she has risk of recurrence of hyperthyroidism.   Patient Instructions  Blood tests are requested for you today.  We'll let you know about the results.  Your require less than a full daily replacement amount of thyroid hormone.  This means that the thyroid was not completely destroyed by the radioactive iodine.  Therefore you have some risk that the overactivity could come back.  this may take many years to happen.  If this happens, you would first notice that your medication requirement would decrease to  none.  Then the overactivity would come back.  We'll follow your blood tests for this.   Please come back for a follow-up appointment in 1 year.

## 2018-11-27 LAB — T4, FREE: Free T4: 1.03 ng/dL (ref 0.60–1.60)

## 2018-11-27 LAB — TSH: TSH: 0.57 u[IU]/mL (ref 0.35–4.50)

## 2018-12-06 ENCOUNTER — Other Ambulatory Visit: Payer: Self-pay | Admitting: Endocrinology

## 2019-03-08 ENCOUNTER — Other Ambulatory Visit: Payer: Self-pay | Admitting: Endocrinology

## 2019-06-13 ENCOUNTER — Other Ambulatory Visit: Payer: Self-pay | Admitting: Endocrinology

## 2019-09-09 ENCOUNTER — Other Ambulatory Visit: Payer: Self-pay | Admitting: Endocrinology

## 2019-11-27 ENCOUNTER — Ambulatory Visit: Payer: BC Managed Care – PPO | Admitting: Endocrinology

## 2019-12-02 ENCOUNTER — Encounter: Payer: Self-pay | Admitting: Endocrinology

## 2019-12-02 ENCOUNTER — Ambulatory Visit (INDEPENDENT_AMBULATORY_CARE_PROVIDER_SITE_OTHER): Payer: BC Managed Care – PPO | Admitting: Endocrinology

## 2019-12-02 ENCOUNTER — Other Ambulatory Visit: Payer: Self-pay

## 2019-12-02 ENCOUNTER — Other Ambulatory Visit: Payer: Self-pay | Admitting: Endocrinology

## 2019-12-02 VITALS — BP 110/68 | HR 53 | Ht 61.0 in | Wt 133.0 lb

## 2019-12-02 DIAGNOSIS — E890 Postprocedural hypothyroidism: Secondary | ICD-10-CM | POA: Diagnosis not present

## 2019-12-02 LAB — TSH: TSH: 2.53 u[IU]/mL (ref 0.35–4.50)

## 2019-12-02 LAB — T4, FREE: Free T4: 0.93 ng/dL (ref 0.60–1.60)

## 2019-12-02 NOTE — Progress Notes (Signed)
Subjective:    Patient ID: Elizabeth Pacheco, female    DOB: June 07, 1960, 59 y.o.   MRN: [id number]  HPI Pt returns for f/u of post-RAI hypothyroidism ([place] had several very small nodules, but scan was c/w Grave's Dz; she had RAI in May of 2015; since then, she has required less than a full dosage of synthroid).  pt states she feels well in general.   Past Medical History:  Diagnosis Date  . Hyperthyroidism     No past surgical history on file.  Social History   Socioeconomic History  . Marital status: Single    Spouse name: Not on file  . Number of children: Not on file  . Years of education: Not on file  . Highest education level: Not on file  Occupational History    Employer: [employer]    Comment: [employer]  Tobacco Use  . Smoking status: Former Smoker    Packs/day: 0.50    Years: 39.00    Pack years: 19.50    Types: Cigarettes    Quit date: 2011    Years since quitting: 10.8  . Smokeless tobacco: Never Used  Substance and Sexual Activity  . Alcohol use: No  . Drug use: No  . Sexual activity: Not on file  Other Topics Concern  . Not on file  Social History Narrative   Mashelle grew up in [place], [place]. She lives at home in [place] with her sister and daughter. They have one dog. She enjoys Web designer projects. She is very active in her church.   Social Determinants of Health   Financial Resource Strain:   . Difficulty of Paying Living Expenses: Not on file  Food Insecurity:   . Worried About Programme researcher, broadcasting/film/video in the Last Year: Not on file  . Ran Out of Food in the Last Year: Not on file  Transportation Needs:   . Lack of Transportation (Medical): Not on file  . Lack of Transportation (Non-Medical): Not on file  Physical Activity:   . Days of Exercise per Week: Not on file  . Minutes of Exercise per Session: Not on file  Stress:   . Feeling of Stress : Not on file  Social Connections:   . Frequency of Communication with Friends and Family: Not on file  .  Frequency of Social Gatherings with Friends and Family: Not on file  . Attends Religious Services: Not on file  . Active Member of Clubs or Organizations: Not on file  . Attends Banker Meetings: Not on file  . Marital Status: Not on file  Intimate Partner Violence:   . Fear of Current or Ex-Partner: Not on file  . Emotionally Abused: Not on file  . Physically Abused: Not on file  . Sexually Abused: Not on file    No current outpatient medications on file prior to visit.   No current facility-administered medications on file prior to visit.    Allergies  Allergen Reactions  . Penicillins     Family History  Problem Relation Age of Onset  . Diabetes Mother   . Cancer Father        Lung cancer  . Diabetes Sister   . Diabetes Brother   . Cancer Maternal Grandmother        Breast cancer  . Diabetes Sister   . Diabetes Sister   . Diabetes Brother   . Diabetes Brother   . Diabetes Brother   . Diabetes Brother  BP 110/68   Pulse (!) 53   Ht 5\' 1"  (1.549 m)   Wt 133 lb (60.3 kg)   SpO2 99%   BMI 25.13 kg/m    Review of Systems     Objective:   Physical Exam VITAL SIGNS:  See vs page GENERAL: no distress NECK: There is no palpable thyroid enlargement.  No thyroid nodule is palpable.  No palpable lymphadenopathy at the anterior neck.   Lab Results  Component Value Date   TSH 2.53 12/02/2019        Assessment & Plan:  Hypothyroidism: well-replaced. Please continue the same synthroid  Patient Instructions  Blood tests are requested for you today.  We'll let you know about the results.  Your require less than a full daily replacement amount of thyroid hormone.  This means that the thyroid was not completely destroyed by the radioactive iodine.  Therefore you have some risk that the overactivity could come back.  this may take many years to happen.  If this happens, you would first notice that your medication requirement would decrease to none.   Then the overactivity would come back.  We'll follow your blood tests for this.   Please come back for a follow-up appointment in 1 year.

## 2019-12-02 NOTE — Patient Instructions (Signed)
Blood tests are requested for you today.  We'll let you know about the results.   ?Your require less than a full daily replacement amount of thyroid hormone.  This means that the thyroid was not completely destroyed by the radioactive iodine.  Therefore you have some risk that the overactivity could come back.  this may take many years to happen.  If this happens, you would first notice that your medication requirement would decrease to none.  Then the overactivity would come back.  We'll follow your blood tests for this.   ?Please come back for a follow-up appointment in 1 year.   ?

## 2019-12-03 ENCOUNTER — Telehealth: Payer: Self-pay

## 2019-12-03 NOTE — Telephone Encounter (Signed)
Patient aware of results and recommendations. °

## 2019-12-03 NOTE — Telephone Encounter (Signed)
-----   Message from Romero Belling, MD sent at 12/02/2019  4:32 PM EDT ----- please contact patient: Normal.  Please continue the same medication.  I'll see you next time.

## 2020-03-04 ENCOUNTER — Other Ambulatory Visit: Payer: Self-pay | Admitting: Endocrinology

## 2020-06-05 ENCOUNTER — Other Ambulatory Visit: Payer: Self-pay | Admitting: Endocrinology

## 2020-09-03 ENCOUNTER — Other Ambulatory Visit: Payer: Self-pay | Admitting: Endocrinology

## 2020-09-14 DIAGNOSIS — Z20822 Contact with and (suspected) exposure to covid-19: Secondary | ICD-10-CM | POA: Diagnosis not present

## 2020-12-01 ENCOUNTER — Ambulatory Visit (INDEPENDENT_AMBULATORY_CARE_PROVIDER_SITE_OTHER): Payer: BC Managed Care – PPO | Admitting: Endocrinology

## 2020-12-01 ENCOUNTER — Other Ambulatory Visit: Payer: Self-pay

## 2020-12-01 VITALS — BP 120/70 | HR 52 | Ht 61.0 in | Wt 138.2 lb

## 2020-12-01 DIAGNOSIS — E890 Postprocedural hypothyroidism: Secondary | ICD-10-CM | POA: Diagnosis not present

## 2020-12-01 NOTE — Progress Notes (Signed)
   Subjective:    Patient ID: Elizabeth Pacheco, female    DOB: 12/04/1960, 60 y.o.   MRN: [id number]  HPI Pt returns for f/u of post-RAI hypothyroidism ([place] had several very small nodules, but scan was c/w Grave's Dz; she had RAI in May of 2015; since then, she has required less than a full dosage of synthroid).  pt states she feels well in general, except for fatigue.   Past Medical History:  Diagnosis Date   Hyperthyroidism     No past surgical history on file.  Social History   Socioeconomic History   Marital status: Single    Spouse name: Not on file   Number of children: Not on file   Years of education: Not on file   Highest education level: Not on file  Occupational History    Employer: [employer]    Comment: [employer]  Tobacco Use   Smoking status: Former    Packs/day: 0.50    Years: 39.00    Pack years: 19.50    Types: Cigarettes    Quit date: 2011    Years since quitting: 11.8   Smokeless tobacco: Never  Substance and Sexual Activity   Alcohol use: No   Drug use: No   Sexual activity: Not on file  Other Topics Concern   Not on file  Social History Narrative   Elizabeth Pacheco grew up in [place], [place]. She lives at home in [place] with her sister and daughter. They have one dog. She enjoys Web designer projects. She is very active in her church.   Social Determinants of Health   Financial Resource Strain: Not on file  Food Insecurity: Not on file  Transportation Needs: Not on file  Physical Activity: Not on file  Stress: Not on file  Social Connections: Not on file  Intimate Partner Violence: Not on file    Current Outpatient Medications on File Prior to Visit  Medication Sig Dispense Refill   levothyroxine (SYNTHROID) 75 MCG tablet TAKE 1 TABLET BY MOUTH EVERY DAY BEFORE BREAKFAST 90 tablet 1   No current facility-administered medications on file prior to visit.    Allergies  Allergen Reactions   Penicillins     Family History  Problem Relation Age of Onset    Diabetes Mother    Cancer Father        Lung cancer   Diabetes Sister    Diabetes Brother    Cancer Maternal Grandmother        Breast cancer   Diabetes Sister    Diabetes Sister    Diabetes Brother    Diabetes Brother    Diabetes Brother    Diabetes Brother     BP 120/70 (BP Location: Right Arm, Patient Position: Sitting, Cuff Size: Normal)   Pulse (!) 52   Ht 5\' 1"  (1.549 m)   Wt 138 lb 3.2 oz (62.7 kg)   SpO2 97%   BMI 26.11 kg/m    Review of Systems     Objective:   Physical Exam VITAL SIGNS:  See vs page GENERAL: no distress NECK: thyroid is normal size, with irreg surface.     Lab Results  Component Value Date   TSH 1.02 12/01/2020      Assessment & Plan:  Hyperthyroidism: well-controlled.  Please continue the same synthroid.

## 2020-12-01 NOTE — Patient Instructions (Addendum)
Your heart rate is slow today.  If today's blood tests are normal, please see your primary care provider soon, to have it rechecked.   Blood tests are requested for you today.  We'll let you know about the results.   Your require less than a full daily replacement amount of thyroid hormone.  This means that the thyroid was not completely destroyed by the radioactive iodine.  Therefore you have some risk that the overactivity could come back.  this may take many years to happen.  If this happens, you would first notice that your medication requirement would decrease to none.  Then the overactivity would come back.  We'll follow your blood tests for this.   Please come back for a follow-up appointment in 1 year.

## 2020-12-02 ENCOUNTER — Telehealth: Payer: Self-pay | Admitting: Endocrinology

## 2020-12-02 LAB — T4, FREE: Free T4: 1.06 ng/dL (ref 0.60–1.60)

## 2020-12-02 LAB — TSH: TSH: 1.02 u[IU]/mL (ref 0.35–5.50)

## 2020-12-02 NOTE — Telephone Encounter (Signed)
Spoke with pt regarding lab results/recommendations 

## 2020-12-02 NOTE — Telephone Encounter (Signed)
Patient requests to be called at ph# [phone] to be given Lab Results and to discuss medication (Per Patient,  Dr. George Hugh Nurse told Patient to wait to pick up medication from Pharmacy in case the medication changed due to Lab results).

## 2021-01-27 DIAGNOSIS — Z1231 Encounter for screening mammogram for malignant neoplasm of breast: Secondary | ICD-10-CM | POA: Diagnosis not present

## 2021-02-26 ENCOUNTER — Other Ambulatory Visit: Payer: Self-pay | Admitting: Endocrinology

## 2021-06-07 ENCOUNTER — Ambulatory Visit: Payer: BLUE CROSS/BLUE SHIELD | Admitting: Endocrinology

## 2021-06-07 VITALS — BP 126/86 | HR 58 | Ht 61.0 in | Wt 142.4 lb

## 2021-06-07 DIAGNOSIS — E890 Postprocedural hypothyroidism: Secondary | ICD-10-CM

## 2021-06-07 NOTE — Progress Notes (Signed)
? ?  Subjective:  ? ? Patient ID: Elizabeth Pacheco, female    DOB: [birth year], 61 y.o.   MRN: [id number] ? ?HPI ?Pt returns for f/u of post-RAI hypothyroidism ([place] had several very small nodules, but scan was c/w Grave's Dz; she had RAI in 2015; since then, she has required less than a full dosage of synthroid).  pt states she feels well in general, except for weight gain and fatigue.   ?Past Medical History:  ?Diagnosis Date  ? Hyperthyroidism   ? ? ?No past surgical history on file. ? ?Social History  ? ?Socioeconomic History  ? Marital status: Single  ?  Spouse name: Not on file  ? Number of children: Not on file  ? Years of education: Not on file  ? Highest education level: Not on file  ?Occupational History  ?  Employer: [employer]  ?  Comment: [employer]  ?Tobacco Use  ? Smoking status: Former  ?  Packs/day: 0.50  ?  Years: 39.00  ?  Pack years: 19.50  ?  Types: Cigarettes  ?  Quit date: 2011  ?  Years since quitting: 12.3  ? Smokeless tobacco: Never  ?Substance and Sexual Activity  ? Alcohol use: No  ? Drug use: No  ? Sexual activity: Not on file  ?Other Topics Concern  ? Not on file  ?Social History Narrative  ? Jovonne grew up in [place], [place]. She lives at home in [place] with her sister and daughter. They have one dog. She enjoys Web designer projects. She is very active in her church.  ? ?Social Determinants of Health  ? ?Financial Resource Strain: Not on file  ?Food Insecurity: Not on file  ?Transportation Needs: Not on file  ?Physical Activity: Not on file  ?Stress: Not on file  ?Social Connections: Not on file  ?Intimate Partner Violence: Not on file  ? ? ?Current Outpatient Medications on File Prior to Visit  ?Medication Sig Dispense Refill  ? levothyroxine (SYNTHROID) 75 MCG tablet TAKE 1 TABLET BY MOUTH EVERY DAY BEFORE BREAKFAST 90 tablet 1  ? ?No current facility-administered medications on file prior to visit.  ? ? ?Allergies  ?Allergen Reactions  ? Penicillins   ? ? ?Family History  ?Problem Relation Age  of Onset  ? Diabetes Mother   ? Cancer Father   ?     Lung cancer  ? Diabetes Sister   ? Diabetes Brother   ? Cancer Maternal Grandmother   ?     Breast cancer  ? Diabetes Sister   ? Diabetes Sister   ? Diabetes Brother   ? Diabetes Brother   ? Diabetes Brother   ? Diabetes Brother   ? ? ?BP 126/86 (BP Location: Left Arm, Patient Position: Sitting, Cuff Size: Normal)   Pulse (!) 58   Ht 5\' 1"  (1.549 m)   Wt 142 lb 6.4 oz (64.6 kg)   SpO2 97%   BMI 26.91 kg/m?  ? ? ?Review of Systems ? ?   ?Objective:  ? Physical Exam ?VITAL SIGNS:  See vs page ?GENERAL: no distress ?NECK: There is no palpable thyroid enlargement.  No thyroid nodule is palpable.  No palpable lymphadenopathy at the anterior neck.   ? ? ?Lab Results  ?Component Value Date  ? TSH 2.07 06/07/2021  ? ?   ?Assessment & Plan:  ?Hypothyroidism: well-controlled.  Please continue the same synthroid ? ?

## 2021-06-07 NOTE — Patient Instructions (Addendum)
Blood tests are requested for you today.  We'll let you know about the results.   ?Your require less than a full daily replacement amount of thyroid hormone.  This means that the thyroid was not completely destroyed by the radioactive iodine.  Therefore you have some risk that the overactivity could come back.  this may take many years to happen.  If this happens, you would first notice that your medication requirement would decrease to none.  Then the overactivity would come back.  We'll follow your blood tests for this.   ?You should have an endocrinology follow-up appointment in 1 year.   ? ?

## 2021-06-08 LAB — TSH: TSH: 2.07 u[IU]/mL (ref 0.35–5.50)

## 2021-06-08 LAB — T4, FREE: Free T4: 0.84 ng/dL (ref 0.60–1.60)

## 2021-09-01 ENCOUNTER — Telehealth: Payer: Self-pay | Admitting: Internal Medicine

## 2021-09-01 MED ORDER — LEVOTHYROXINE SODIUM 75 MCG PO TABS: 75.0000 ug | ORAL_TABLET | Freq: Every day | ORAL | 1 refills | Status: DC

## 2021-09-01 NOTE — Telephone Encounter (Signed)
Elizabeth Pacheco needs a refill of Levothoroxin called in to [facility] in [place], [place]. She has an upcoming appointment with [facility].

## 2021-12-07 ENCOUNTER — Ambulatory Visit: Payer: BC Managed Care – PPO | Admitting: Endocrinology

## 2021-12-10 ENCOUNTER — Emergency Department: Payer: No Typology Code available for payment source

## 2021-12-10 ENCOUNTER — Other Ambulatory Visit: Payer: Self-pay

## 2021-12-10 ENCOUNTER — Encounter: Payer: Self-pay | Admitting: Emergency Medicine

## 2021-12-10 ENCOUNTER — Emergency Department
Admission: EM | Admit: 2021-12-10 | Discharge: 2021-12-10 | Disposition: A | Discharge status: Home / Self Care | Payer: No Typology Code available for payment source | Attending: Emergency Medicine | Admitting: Emergency Medicine

## 2021-12-10 DIAGNOSIS — N200 Calculus of kidney: Secondary | ICD-10-CM

## 2021-12-10 DIAGNOSIS — E039 Hypothyroidism, unspecified: Secondary | ICD-10-CM | POA: Diagnosis not present

## 2021-12-10 DIAGNOSIS — N132 Hydronephrosis with renal and ureteral calculous obstruction: Secondary | ICD-10-CM | POA: Diagnosis not present

## 2021-12-10 DIAGNOSIS — R109 Unspecified abdominal pain: Secondary | ICD-10-CM

## 2021-12-10 DIAGNOSIS — N179 Acute kidney failure, unspecified: Secondary | ICD-10-CM | POA: Insufficient documentation

## 2021-12-10 DIAGNOSIS — D72829 Elevated white blood cell count, unspecified: Secondary | ICD-10-CM | POA: Diagnosis not present

## 2021-12-10 DIAGNOSIS — R1031 Right lower quadrant pain: Secondary | ICD-10-CM | POA: Diagnosis present

## 2021-12-10 LAB — CBC
HCT: 42.1 % (ref 36.0–46.0)
Hemoglobin: 14.2 g/dL (ref 12.0–15.0)
MCH: 29.9 pg (ref 26.0–34.0)
MCHC: 33.7 g/dL (ref 30.0–36.0)
MCV: 88.6 fL (ref 80.0–100.0)
Platelets: 218 10*3/uL (ref 150–400)
RBC: 4.75 MIL/uL (ref 3.87–5.11)
RDW: 12.2 % (ref 11.5–15.5)
WBC: 13.6 10*3/uL — ABNORMAL HIGH (ref 4.0–10.5)
nRBC: 0 % (ref 0.0–0.2)

## 2021-12-10 LAB — COMPREHENSIVE METABOLIC PANEL
ALT: 21 U/L (ref 0–44)
AST: 22 U/L (ref 15–41)
Albumin: 4.1 g/dL (ref 3.5–5.0)
Alkaline Phosphatase: 104 U/L (ref 38–126)
Anion gap: 8 (ref 5–15)
BUN: 20 mg/dL (ref 8–23)
CO2: 24 mmol/L (ref 22–32)
Calcium: 9.5 mg/dL (ref 8.9–10.3)
Chloride: 108 mmol/L (ref 98–111)
Creatinine, Ser: 1.23 mg/dL — ABNORMAL HIGH (ref 0.44–1.00)
GFR, Estimated: 49 mL/min — ABNORMAL LOW (ref >60)
Glucose, Bld: 130 mg/dL — ABNORMAL HIGH (ref 70–99)
Potassium: 3.9 mmol/L (ref 3.5–5.1)
Sodium: 140 mmol/L (ref 135–145)
Total Bilirubin: 0.5 mg/dL (ref 0.3–1.2)
Total Protein: 7.8 g/dL (ref 6.5–8.1)

## 2021-12-10 LAB — URINALYSIS, ROUTINE W REFLEX MICROSCOPIC
Bilirubin Urine: NEGATIVE
Glucose, UA: NEGATIVE mg/dL
Hgb urine dipstick: NEGATIVE
Ketones, ur: NEGATIVE mg/dL
Nitrite: NEGATIVE
Protein, ur: NEGATIVE mg/dL
Specific Gravity, Urine: 1.021 (ref 1.005–1.030)
pH: 5 (ref 5.0–8.0)

## 2021-12-10 LAB — LIPASE, BLOOD: Lipase: 44 U/L (ref 11–51)

## 2021-12-10 MED ORDER — OXYCODONE-ACETAMINOPHEN 5-325 MG PO TABS: 1.0000 | ORAL_TABLET | ORAL | 0 refills | Status: AC | PRN

## 2021-12-10 MED ORDER — LACTATED RINGERS IV BOLUS
1000.0000 mL | Freq: Once | INTRAVENOUS | Status: AC
  Administered 2021-12-10: 1000 mL via INTRAVENOUS

## 2021-12-10 MED ORDER — IOHEXOL 300 MG/ML  SOLN
100.0000 mL | Freq: Once | INTRAMUSCULAR | Status: AC | PRN
  Administered 2021-12-10: 100 mL via INTRAVENOUS

## 2021-12-10 MED ORDER — ONDANSETRON 4 MG PO TBDP: 4.0000 mg | ORAL_TABLET | Freq: Three times a day (TID) | ORAL | 0 refills | Status: DC | PRN

## 2021-12-10 NOTE — ED Triage Notes (Signed)
Pt with right flank pain since this morning  n/v "dark yellow"   Has held down her morning meds and drank hot tea.  Pain has persisted even after lying down to rest.  Pain described as "toothache" like pain in her right flank. Waxing and waning.

## 2021-12-10 NOTE — ED Provider Notes (Signed)
[facility] Provider Note    Event Date/Time   First MD Initiated Contact with Patient 12/10/21 1526     (approximate)   History   Chief Complaint Flank Pain   HPI  Elizabeth Pacheco is a 61 y.o. female with past medical history of Graves' disease and acquired hypothyroidism who presents to the ED complaining of abdominal pain.  Patient reports sudden onset of severe pain in the right lower quadrant of her abdomen earlier this morning while she was getting ready to go to church.  Pain has been present constantly since then, has eased up slightly in terms of severity but remains present.  She describes the pain as sharp and stabbing, associated with nausea and one episode of vomiting.  She denies any changes in her bowel movements and has not had any dysuria or hematuria.  She denies any pain in her right flank and has not had any fevers.  She denies any history of kidney stones and has never had her appendix removed.     Physical Exam   Triage Vital Signs: ED Triage Vitals [12/10/21 1431]  Enc Vitals Group     BP (!) 174/85     Pulse Rate 61     Resp 18     Temp 98.9 F (37.2 C)     Temp Source Oral     SpO2 98 %     Weight      Height      Head Circumference      Peak Flow      Pain Score 7     Pain Loc      Pain Edu?      Excl. in GC?     Most recent vital signs: Vitals:   12/10/21 1431 12/10/21 1829  BP: (!) 174/85   Pulse: 61   Resp: 18   Temp: 98.9 F (37.2 C) 99.5 F (37.5 C)  SpO2: 98%     Constitutional: Alert and oriented. Eyes: Conjunctivae are normal. Head: Atraumatic. Nose: No congestion/rhinnorhea. Mouth/Throat: Mucous membranes are moist.  Cardiovascular: Normal rate, regular rhythm. Grossly normal heart sounds.  2+ radial pulses bilaterally. Respiratory: Normal respiratory effort.  No retractions. Lungs CTAB. Gastrointestinal: Soft and nontender.  No CVA tenderness bilaterally.  No distention. Musculoskeletal: No lower  extremity tenderness nor edema.  Neurologic:  Normal speech and language. No gross focal neurologic deficits are appreciated.    ED Results / Procedures / Treatments   Labs (all labs ordered are listed, but only abnormal results are displayed) Labs Reviewed  COMPREHENSIVE METABOLIC PANEL - Abnormal; Notable for the following components:      Result Value   Glucose, Bld 130 (*)    Creatinine, Ser 1.23 (*)    GFR, Estimated 49 (*)    All other components within normal limits  CBC - Abnormal; Notable for the following components:   WBC 13.6 (*)    All other components within normal limits  URINALYSIS, ROUTINE W REFLEX MICROSCOPIC - Abnormal; Notable for the following components:   Color, Urine YELLOW (*)    APPearance HAZY (*)    Leukocytes,Ua TRACE (*)    Bacteria, UA RARE (*)    All other components within normal limits  URINE CULTURE  LIPASE, BLOOD   RADIOLOGY CT abdomen/pelvis reviewed and interpreted by me with right distal ureteral stone with associated hydronephrosis.  PROCEDURES:  Critical Care performed: No  Procedures   MEDICATIONS ORDERED IN ED: Medications  lactated ringers  bolus 1,000 mL (1,000 mLs Intravenous New Bag/Given 12/10/21 1826)  iohexol (OMNIPAQUE) 300 MG/ML solution 100 mL (100 mLs Intravenous Contrast Given 12/10/21 1842)     IMPRESSION / MDM / ASSESSMENT AND PLAN / ED COURSE  I reviewed the triage vital signs and the nursing notes.                              61 y.o. female with past medical history of Graves' disease and acquired hypothyroidism who presents to the ED complaining of sudden onset severe pain in the right lower quadrant of her abdomen that has improved since the time of onset.  Patient's presentation is most consistent with acute presentation with potential threat to life or bodily function.  Differential diagnosis includes, but is not limited to, kidney stone, pyelonephritis, appendicitis, cystitis, electrolyte  abnormality, AKI.  Patient nontoxic-appearing and in no acute distress, vital signs are unremarkable.  Patient's pain seems to be improving and is not reproducible with palpation of her abdomen or her costovertebral area.  Labs are remarkable for mild leukocytosis as well as mild AKI, no significant anemia or electrolyte abnormality noted.  LFTs and lipase are unremarkable, urinalysis is pending.  We will further assess with CT imaging for appendicitis versus kidney stone, patient declines pain medication at this time.  CT scan remarkable for right distal ureteral stone with associated hydronephrosis, which appears to be the source of patient's pain.  No evidence for appendicitis and urinalysis shows no signs of infection.  Patient is appropriate for discharge home with urology follow-up as needed, will prescribe short course of pain and nausea medication.  She was counseled to return to the ED for new or worsening symptoms, patient agrees with plan.      FINAL CLINICAL IMPRESSION(S) / ED DIAGNOSES   Final diagnoses:  Right flank pain  Kidney stone     Rx / DC Orders   ED Discharge Orders          Ordered    oxyCODONE-acetaminophen (PERCOCET) 5-325 MG tablet  Every 4 hours PRN        12/10/21 1920    ondansetron (ZOFRAN-ODT) 4 MG disintegrating tablet  Every 8 hours PRN        12/10/21 1920             Note:  This document was prepared using Dragon voice recognition software and may include unintentional dictation errors.   Blake Divine, MD 12/10/21 [phone]

## 2021-12-11 LAB — URINE CULTURE: Culture: <10000 — AB

## 2022-01-18 ENCOUNTER — Encounter: Payer: Self-pay | Admitting: Internal Medicine

## 2022-01-18 ENCOUNTER — Ambulatory Visit (INDEPENDENT_AMBULATORY_CARE_PROVIDER_SITE_OTHER): Payer: No Typology Code available for payment source | Admitting: Internal Medicine

## 2022-01-18 VITALS — BP 124/82 | HR 59 | Ht 61.0 in | Wt 143.0 lb

## 2022-01-18 DIAGNOSIS — R635 Abnormal weight gain: Secondary | ICD-10-CM

## 2022-01-18 DIAGNOSIS — E890 Postprocedural hypothyroidism: Secondary | ICD-10-CM | POA: Diagnosis not present

## 2022-01-18 LAB — TSH: TSH: 1.85 u[IU]/mL (ref 0.35–5.50)

## 2022-01-18 NOTE — Patient Instructions (Signed)

## 2022-01-18 NOTE — Progress Notes (Signed)
Name: Elizabeth Pacheco  MRN/ DOB: [id number], 10-Dec-1960    Age/ Sex: 61 y.o., female     PCP: Novella Olive, NP   Reason for Endocrinology Evaluation: Postablative hypothyroidism     Initial Endocrinology Clinic Visit: 05/01/2013    PATIENT IDENTIFIER: Elizabeth Pacheco is a 61 y.o., female with a past medical history of Hx of Graves' disease, post ablative hypothyroid. She has followed with [facility] since 05/01/2013 for consultative assistance with management of her post ablative hypothyroid.   HISTORICAL SUMMARY:  She is S/P RAI ablation in 2015 secondary to Graves' disease with 21.4 mCi of I-131  Of note the patient does have small thyroid nodules on ultrasound but thyroid uptake and scan was consistent with Graves' disease rather than toxic thyroid nodules.  She has been on LT-for replacement   Sister with thyroid disease   SUBJECTIVE:    Today (01/18/2022):  Elizabeth Pacheco is here for a follow up on postablative hypothyroidism   Weight has been stable  She takes levothyroxine appropriately  Denies constipation  Denies palpitations or tremors  Denies local neck swelling  Levothyroxine 75 mcg daily     HISTORY:  Past Medical History:  Past Medical History:  Diagnosis Date   Hyperthyroidism    Past Surgical History: No past surgical history on file. Social History:  reports that she quit smoking about 12 years ago. Her smoking use included cigarettes. She has a 19.50 pack-year smoking history. She has never used smokeless tobacco. She reports that she does not drink alcohol and does not use drugs. Family History:  Family History  Problem Relation Age of Onset   Diabetes Mother    Cancer Father        Lung cancer   Diabetes Sister    Diabetes Brother    Cancer Maternal Grandmother        Breast cancer   Diabetes Sister    Diabetes Sister    Diabetes Brother    Diabetes Brother    Diabetes Brother    Diabetes Brother      HOME  MEDICATIONS: Allergies as of 01/18/2022       Reactions   Penicillins         Medication List        Accurate as of January 18, 2022 10:20 AM. If you have any questions, ask your nurse or doctor.          STOP taking these medications    ondansetron 4 MG disintegrating tablet Commonly known as: ZOFRAN-ODT Stopped by: Scarlette Shorts, MD       TAKE these medications    levothyroxine 75 MCG tablet Commonly known as: SYNTHROID Take 1 tablet (75 mcg total) by mouth daily before breakfast.   oxyCODONE-acetaminophen 5-325 MG tablet Commonly known as: Percocet Take 1 tablet by mouth every 4 (four) hours as needed for severe pain.          OBJECTIVE:   PHYSICAL EXAM: VS: BP 124/82 (BP Location: Left Arm, Patient Position: Sitting, Cuff Size: Small)   Pulse (!) 59   Ht 5\' 1"  (1.549 m)   Wt 143 lb (64.9 kg)   SpO2 98%   BMI 27.02 kg/m    EXAM: General: Pt appears well and is in NAD  Eyes: External eye exam normal without stare, lid lag or exophthalmos.  EOM intact.    Neck: General: Supple without adenopathy. Thyroid: Thyroid size normal.  No goiter or nodules appreciated. No thyroid bruit.  Lungs: Clear with good BS bilat with no rales, rhonchi, or wheezes  Heart: Auscultation: RRR.  Abdomen: Normoactive bowel sounds, soft, nontender, without masses or organomegaly palpable  Extremities:  BL LE: No pretibial edema normal ROM and strength.  Mental Status: Judgment, insight: Intact Orientation: Oriented to time, place, and person Mood and affect: No depression, anxiety, or agitation     DATA REVIEWED:   Latest Reference Range & Units 01/18/22 10:24  TSH 0.35 - 5.50 uIU/mL 1.85     ASSESSMENT / PLAN / RECOMMENDATIONS:   Postablative Hypothyroidism    -Patient is clinically euthyroid -No local neck symptoms - Pt educated extensively on the correct way to take levothyroxine (first thing in the morning with water, 30 minutes before eating or  taking other medications). - Pt encouraged to double dose the following day if she were to miss a dose given long half-life of levothyroxine.   Medications  Continue levothyroxine 75 mcg daily   2. Weight gain:  -Patient attributes this to thyroid disease, I did explain to the patient that her TSH has been normal and she is on optimal dose of LT-for replacement. -Emphasized the importance of avoiding sugar sweetened beverages, avoiding snacks, eating healthy well-balanced meals and regular exercise   Signed electronically by: Lyndle Herrlich, MD  [facility] Endocrinology  [facility] [address] [place] [place], [place] [address] Phone: [phone] FAX: [phone]      CC: Novella Olive, NP [address] [place] [address] Phone: [phone]  Fax: [phone]   Return to Endocrinology clinic as below: No future appointments.

## 2022-01-19 ENCOUNTER — Encounter: Payer: Self-pay | Admitting: Internal Medicine

## 2022-01-19 MED ORDER — LEVOTHYROXINE SODIUM 75 MCG PO TABS: 75.0000 ug | ORAL_TABLET | Freq: Every day | ORAL | 3 refills | Status: DC

## 2023-01-21 ENCOUNTER — Encounter: Payer: Self-pay | Admitting: Internal Medicine

## 2023-01-21 ENCOUNTER — Ambulatory Visit (INDEPENDENT_AMBULATORY_CARE_PROVIDER_SITE_OTHER): Payer: No Typology Code available for payment source | Admitting: Internal Medicine

## 2023-01-21 VITALS — BP 116/74 | HR 68 | Ht 61.0 in | Wt 143.0 lb

## 2023-01-21 DIAGNOSIS — E890 Postprocedural hypothyroidism: Secondary | ICD-10-CM

## 2023-01-21 DIAGNOSIS — R5383 Other fatigue: Secondary | ICD-10-CM

## 2023-01-21 NOTE — Patient Instructions (Signed)

## 2023-01-21 NOTE — Progress Notes (Unsigned)
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 She has followed with [facility] since 05/01/2013 for consultative assistance with management of her post ablative hypothyroid.   HISTORICAL SUMMARY:  She is S/P RAI ablation in 2015 secondary to Graves' disease with 21.4 mCi of I-131  Of note the patient does have small thyroid nodules on ultrasound but thyroid uptake and scan was consistent with Graves' disease rather than toxic thyroid nodules.  She has been on LT-for replacement   Sister with thyroid disease   SUBJECTIVE:    Today (01/21/2023):  Elizabeth Pacheco is here for a follow up on postablative hypothyroidism   Weight has been stable  Denies local neck swelling  Denies constipation or diarrhea  Denies palpitations  She has been tired lately , she doesn't;t sleep well at night  She is not on Vitamins   Levothyroxine 75 mcg daily     HISTORY:  Past Medical History:  Past Medical History:  Diagnosis Date   Hyperthyroidism    Past Surgical History: No past surgical history on file. Social History:  reports that she quit smoking about 13 years ago. Her smoking use included cigarettes. She started smoking about 52 years ago. She has a 19.5 pack-year smoking history. She has never used smokeless tobacco. She reports that she does not drink alcohol and does not use drugs. Family History:  Family History  Problem Relation Age of Onset   Diabetes Mother    Cancer Father        Lung cancer   Diabetes Sister    Diabetes Brother    Cancer Maternal Grandmother        Breast cancer   Diabetes Sister    Diabetes Sister    Diabetes Brother     Diabetes Brother    Diabetes Brother    Diabetes Brother      HOME MEDICATIONS: Allergies as of 01/21/2023       Reactions   Penicillins         Medication List        Accurate as of January 21, 2023  2:50 PM. If you have any questions, ask your nurse or doctor.          levothyroxine 75 MCG tablet Commonly known as: SYNTHROID Take 1 tablet (75 mcg total) by mouth daily before breakfast.          OBJECTIVE:   PHYSICAL EXAM: VS: BP 116/74 (BP Location: Left Arm, Patient Position: Sitting, Cuff Size: Small)   Pulse 68   Ht 5\' 1"  (1.549 m)   Wt 143 lb (64.9 kg)   SpO2 99%   BMI 27.02 kg/m    EXAM: General: Pt appears well and is in NAD  Neck: General: Supple without adenopathy. Thyroid:  No goiter or nodules appreciated  Lungs: Clear with good BS bilat   Heart: Auscultation: RRR.  Abdomen: soft, nontender  Extremities:  BL LE: No pretibial edema normal  Mental Status: Judgment, insight: Intact Orientation: Oriented to time, place, and person Mood and affect: No depression, anxiety, or agitation     DATA REVIEWED:  Latest Reference Range & Units 01/21/23  15:09  WBC 3.8 - 10.8 Thousand/uL 9.4  RBC 3.80 - 5.10 Million/uL 4.45  Hemoglobin 11.7 - 15.5 g/dL 84.1  HCT 66.0 - 63.0 % 39.5  MCV 80.0 - 100.0 fL 88.8  MCH 27.0 - 33.0 pg 29.9  MCHC 32.0 - 36.0 g/dL 16.0  RDW 10.9 - 32.3 % 12.0  Platelets 140 - 400 Thousand/uL 219  MPV 7.5 - 12.5 fL 12.0  TSH 0.40 - 4.50 mIU/L 2.43  T4,Free(Direct) 0.8 - 1.8 ng/dL 1.1    ASSESSMENT / PLAN / RECOMMENDATIONS:   Postablative Hypothyroidism    -Patient is clinically euthyroid except for fatigue -No local neck symptoms - Pt educated extensively on the correct way to take levothyroxine (first thing in the morning with water, 30 minutes before eating or taking other medications). - Pt encouraged to double dose the following day if she were to miss a dose given long half-life of  levothyroxine.   Medications  Continue levothyroxine 75 mcg daily  2. Fatigue :  -TSH and CBC normal -Will encourage hydration and rest   F/U in 1 yr    Signed electronically by: Lyndle Herrlich, MD  [facility] Endocrinology  [facility] [address] [place], [place] [address] Phone: [phone] FAX: [phone]      CC: Novella Olive, NP [address] [place] [address] Phone: [phone]  Fax: [phone]   Return to Endocrinology clinic as below: Future Appointments  Date Time Provider Department Center  01/21/2023  3:00 PM Marcie Shearon, Konrad Dolores, MD [id number] None

## 2023-01-22 ENCOUNTER — Telehealth: Payer: Self-pay | Admitting: Internal Medicine

## 2023-01-22 LAB — T4, FREE: Free T4: 1.1 ng/dL (ref 0.8–1.8)

## 2023-01-22 LAB — CBC
HCT: 39.5 % (ref 35.0–45.0)
Hemoglobin: 13.3 g/dL (ref 11.7–15.5)
MCH: 29.9 pg (ref 27.0–33.0)
MCHC: 33.7 g/dL (ref 32.0–36.0)
MCV: 88.8 fL (ref 80.0–100.0)
MPV: 12 fL (ref 7.5–12.5)
Platelets: 219 10*3/uL (ref 140–400)
RBC: 4.45 10*6/uL (ref 3.80–5.10)
RDW: 12 % (ref 11.0–15.0)
WBC: 9.4 10*3/uL (ref 3.8–10.8)

## 2023-01-22 LAB — TSH: TSH: 2.43 m[IU]/L (ref 0.40–4.50)

## 2023-01-22 MED ORDER — LEVOTHYROXINE SODIUM 75 MCG PO TABS: 75.0000 ug | ORAL_TABLET | Freq: Every day | ORAL | 3 refills | Status: DC

## 2023-01-22 NOTE — Telephone Encounter (Signed)
LMTCB

## 2023-01-22 NOTE — Telephone Encounter (Signed)
Please let the patient know that her thyroid test is within normal range as well as her hemoglobin, which means she has no anemia    Please encourage the patient to stay hydrated as she has been tired   Thanks

## 2023-01-23 NOTE — Telephone Encounter (Signed)
Patient aware.

## 2024-01-21 ENCOUNTER — Other Ambulatory Visit: Payer: Medicare (Managed Care)

## 2024-01-21 ENCOUNTER — Ambulatory Visit: Payer: Medicare (Managed Care) | Admitting: Internal Medicine

## 2024-01-21 VITALS — BP 158/82 | HR 89 | Ht 61.0 in | Wt 147.0 lb

## 2024-01-21 DIAGNOSIS — R8290 Unspecified abnormal findings in urine: Secondary | ICD-10-CM

## 2024-01-21 DIAGNOSIS — E890 Postprocedural hypothyroidism: Secondary | ICD-10-CM

## 2024-01-21 NOTE — Patient Instructions (Signed)

## 2024-01-21 NOTE — Progress Notes (Unsigned)
 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 She has followed with [facility] since 05/01/2013 for consultative assistance with management of her post ablative hypothyroid.   HISTORICAL SUMMARY:  She is S/P RAI ablation in 2015 secondary to Graves' disease with 21.4 mCi of I-131  Of note the patient does have small thyroid  nodules on ultrasound but thyroid  uptake and scan was consistent with Graves' disease rather than toxic thyroid  nodules.  She has been on LT-for replacement   Sister with thyroid  disease   SUBJECTIVE:    Today (01/21/2024):  Elizabeth Pacheco is here for a follow up on postablative hypothyroidism   No palpitations  No constipation or diarrhea  No tremors  No Biotin  She is on MVI , Vit B 12  The patient has noted foul urinary smell, no dysuria or hematuria.  She did start using OTC probiotics recently  Levothyroxine  75 mcg daily     HISTORY:  Past Medical History:  Past Medical History:  Diagnosis Date   Hyperthyroidism    Past Surgical History: No past surgical history on file. Social History:  reports that she quit smoking about 14 years ago. Her smoking use included cigarettes. She started smoking about 53 years ago. She has a 19.5 pack-year smoking history. She has never used smokeless tobacco. She reports that she does not drink alcohol and does not use drugs. Family History:  Family History  Problem Relation Age of Onset   Diabetes Mother    Cancer Father        Lung cancer   Diabetes Sister    Diabetes Brother    Cancer Maternal Grandmother        Breast cancer   Diabetes Sister    Diabetes Sister     Diabetes Brother    Diabetes Brother    Diabetes Brother    Diabetes Brother      HOME MEDICATIONS: Allergies as of 01/21/2024       Reactions   Penicillins         Medication List        Accurate as of January 21, 2024  3:02 PM. If you have any questions, ask your nurse or doctor.          levothyroxine  75 MCG tablet Commonly known as: SYNTHROID  Take 1 tablet (75 mcg total) by mouth daily before breakfast.          OBJECTIVE:   PHYSICAL EXAM: VS: BP (!) 158/82   Pulse 89   Ht 5' 1 (1.549 m)   Wt 147 lb (66.7 kg)   SpO2 96%   BMI 27.78 kg/m    EXAM: General: Pt appears well and is in NAD  Neck: General: Supple without adenopathy. Thyroid :  No goiter or nodules appreciated  Lungs: Clear with good BS bilat   Heart: Auscultation: RRR.  Abdomen: soft, nontender  Extremities:  BL LE: No pretibial edema normal  Mental Status: Judgment, insight: Intact Orientation: Oriented to time, place, and person Mood and affect: No depression, anxiety, or agitation     DATA REVIEWED:  Latest Reference Range & Units 01/21/23 15:09  WBC 3.8 -  10.8 Thousand/uL 9.4  RBC 3.80 - 5.10 Million/uL 4.45  Hemoglobin 11.7 - 15.5 g/dL 86.6  HCT 64.9 - 54.9 % 39.5  MCV 80.0 - 100.0 fL 88.8  MCH 27.0 - 33.0 pg 29.9  MCHC 32.0 - 36.0 g/dL 66.2  RDW 88.9 - 84.9 % 12.0  Platelets 140 - 400 Thousand/uL 219  MPV 7.5 - 12.5 fL 12.0  TSH 0.40 - 4.50 mIU/L 2.43  T4,Free(Direct) 0.8 - 1.8 ng/dL 1.1    ASSESSMENT / PLAN / RECOMMENDATIONS:   Postablative Hypothyroidism    -Patient is clinically euthyroid -No local neck symptoms - She has been taking levothyroxine  appropriately, but I did ask her to obtain a pillbox  Medications  Continue levothyroxine  75 mcg daily   2.  Foul urine odor:   - UA*****  F/U in 1 yr    Signed electronically by: Stefano Redgie Butts, MD  [facility]ocrin[facility]  [facility] [address] [place] [place],  [place] [address] Phone: [phone] FAX: [phone]      CC: Larina Lilia HERO, NP [address] [place] [address] Phone: [phone]  Fax: [phone]   Return to Endocrinology clinic as below: No future appointments.

## 2024-01-22 ENCOUNTER — Ambulatory Visit: Payer: Self-pay | Admitting: Internal Medicine

## 2024-01-22 DIAGNOSIS — E890 Postprocedural hypothyroidism: Secondary | ICD-10-CM

## 2024-01-22 DIAGNOSIS — R8290 Unspecified abnormal findings in urine: Secondary | ICD-10-CM

## 2024-01-22 LAB — URINALYSIS, ROUTINE W REFLEX MICROSCOPIC
Bilirubin Urine: NEGATIVE
Glucose, UA: NEGATIVE
Hgb urine dipstick: NEGATIVE
Hyaline Cast: NONE SEEN /LPF
Ketones, ur: NEGATIVE
Nitrite: NEGATIVE
Protein, ur: NEGATIVE
RBC / HPF: NONE SEEN /HPF (ref 0–2)
Specific Gravity, Urine: 1.022 (ref 1.001–1.035)
pH: <=5 — AB (ref 5.0–8.0)

## 2024-01-22 LAB — MICROSCOPIC MESSAGE

## 2024-01-22 LAB — TSH: TSH: 1.19 m[IU]/L (ref 0.40–4.50)

## 2024-01-22 LAB — T4, FREE: Free T4: 1.2 ng/dL (ref 0.8–1.8)

## 2024-01-22 MED ORDER — CIPROFLOXACIN HCL 500 MG PO TABS: 500.0000 mg | ORAL_TABLET | Freq: Two times a day (BID) | ORAL | 0 refills | Status: DC

## 2024-01-22 MED ORDER — CIPROFLOXACIN HCL 500 MG PO TABS: 500.0000 mg | ORAL_TABLET | Freq: Two times a day (BID) | ORAL | 0 refills | Status: AC

## 2024-01-22 MED ORDER — LEVOTHYROXINE SODIUM 75 MCG PO TABS: 75.0000 ug | ORAL_TABLET | Freq: Every day | ORAL | 3 refills | Status: DC

## 2024-01-22 MED ORDER — LEVOTHYROXINE SODIUM 75 MCG PO TABS: 75.0000 ug | ORAL_TABLET | Freq: Every day | ORAL | 3 refills | Status: AC

## 2024-01-22 NOTE — Telephone Encounter (Signed)
 Patient aware of results and recommendations.

## 2024-01-22 NOTE — Telephone Encounter (Signed)
 Please let the patient know that she has a urinary tract infection.  I have sent a prescription for antibiotic ciprofloxacin 1 tablet twice daily for 3 days     Thyroid  test is normal, continue current dose of levothyroxine     Thanks

## 2025-01-20 ENCOUNTER — Ambulatory Visit: Payer: Medicare (Managed Care) | Admitting: Internal Medicine
